# Patient Record
Sex: Female | Born: 1995 | Race: Black or African American | Hispanic: No | Marital: Single | Smoking: Never smoker
Health system: Southern US, Community
[De-identification: ages and names within clinical notes are randomized; demographics above are authoritative.]

## PROBLEM LIST (undated history)

## (undated) DIAGNOSIS — I10 Essential (primary) hypertension: Secondary | ICD-10-CM

## (undated) DIAGNOSIS — R197 Diarrhea, unspecified: Secondary | ICD-10-CM

## (undated) HISTORY — DX: Essential (primary) hypertension: I10

## (undated) HISTORY — DX: Diarrhea, unspecified: R19.7

---
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
## 2013-07-10 DIAGNOSIS — R197 Diarrhea, unspecified: Secondary | ICD-10-CM

## 2013-07-10 HISTORY — DX: Diarrhea, unspecified: R19.7

## 2014-02-24 LAB — OB RESULTS CONSOLE HEPATITIS B SURFACE ANTIGEN
Hepatitis B Surface Ag: NEGATIVE
Hepatitis B Surface Ag: NEGATIVE

## 2014-02-24 LAB — OB RESULTS CONSOLE ABO/RH: RH Type: POSITIVE

## 2014-02-24 LAB — OB RESULTS CONSOLE PLATELET COUNT: PLATELETS: 201 10*3/uL

## 2014-02-24 LAB — OB RESULTS CONSOLE HGB/HCT, BLOOD
HEMATOCRIT: 38 %
Hemoglobin: 13.2 g/dL

## 2014-02-24 LAB — OB RESULTS CONSOLE RUBELLA ANTIBODY, IGM: RUBELLA: IMMUNE

## 2014-02-24 LAB — OB RESULTS CONSOLE ANTIBODY SCREEN: Antibody Screen: NEGATIVE

## 2014-02-24 LAB — OB RESULTS CONSOLE HIV ANTIBODY (ROUTINE TESTING): HIV: NONREACTIVE

## 2014-06-08 ENCOUNTER — Emergency Department (HOSPITAL_COMMUNITY)
Admission: EM | Admit: 2014-06-08 | Discharge: 2014-06-08 | Disposition: A | Discharge status: Home / Self Care | Payer: Medicaid Other | Attending: Emergency Medicine | Admitting: Emergency Medicine

## 2014-06-08 ENCOUNTER — Encounter (HOSPITAL_COMMUNITY): Payer: Self-pay | Admitting: Emergency Medicine

## 2014-06-08 DIAGNOSIS — J069 Acute upper respiratory infection, unspecified: Secondary | ICD-10-CM | POA: Insufficient documentation

## 2014-06-08 DIAGNOSIS — M79672 Pain in left foot: Secondary | ICD-10-CM | POA: Diagnosis present

## 2014-06-08 DIAGNOSIS — Z79899 Other long term (current) drug therapy: Secondary | ICD-10-CM | POA: Diagnosis not present

## 2014-06-08 DIAGNOSIS — R2242 Localized swelling, mass and lump, left lower limb: Secondary | ICD-10-CM | POA: Insufficient documentation

## 2014-06-08 DIAGNOSIS — M7989 Other specified soft tissue disorders: Secondary | ICD-10-CM

## 2014-06-08 LAB — CBG MONITORING, ED: Glucose-Capillary: 79 mg/dL (ref 70–99)

## 2014-06-08 MED ORDER — DIPHENHYDRAMINE HCL 25 MG PO CAPS
25.0000 mg | ORAL_CAPSULE | Freq: Once | ORAL | Status: AC
  Administered 2014-06-08: 25 mg via ORAL
  Filled 2014-06-08: qty 1

## 2014-06-08 NOTE — ED Notes (Signed)
The pt  Is c/o multiple complaints   She thinks she was bitten by a spider 0900 am today.  She has a headache numbness in arms hand and feet since then  Coughing nauseated abd pain when she coughs.  lmp  Unknown 24 weeks preg .  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

## 2014-06-08 NOTE — ED Notes (Signed)
The pts due date is march 17th.. Ob rapid response contacted dr Norlene Campbellotter will notify her if she feels that the pt needs to be seen by her.

## 2014-06-08 NOTE — ED Notes (Signed)
Patient here with complaint of left foot pain and swelling, thinks she may have been bitten by a spider but is unsure. States that additionally she has numbness and tingling in all 4 limbs accompanied by a headache. Patient is [redacted] weeks pregnant with 1st child.

## 2014-06-08 NOTE — Discharge Instructions (Signed)
Keep foot elevated.  Ice as tolerated.  You can take tylenol for pain, benadryl for itching.  Use the handout for medications safe in pregnancy.     Upper Respiratory Infection, Adult An upper respiratory infection (URI) is also sometimes known as the common cold. The upper respiratory tract includes the nose, sinuses, throat, trachea, and bronchi. Bronchi are the airways leading to the lungs. Most people improve within 1 week, but symptoms can last up to 2 weeks. A residual cough may last even longer.  CAUSES Many different viruses can infect the tissues lining the upper respiratory tract. The tissues become irritated and inflamed and often become very moist. Mucus production is also common. A cold is contagious. You can easily spread the virus to others by oral contact. This includes kissing, sharing a glass, coughing, or sneezing. Touching your mouth or nose and then touching a surface, which is then touched by another person, can also spread the virus. SYMPTOMS  Symptoms typically develop 1 to 3 days after you come in contact with a cold virus. Symptoms vary from person to person. They may include:  Runny nose.  Sneezing.  Nasal congestion.  Sinus irritation.  Sore throat.  Loss of voice (laryngitis).  Cough.  Fatigue.  Muscle aches.  Loss of appetite.  Headache.  Low-grade fever. DIAGNOSIS  You might diagnose your own cold based on familiar symptoms, since most people get a cold 2 to 3 times a year. Your caregiver can confirm this based on your exam. Most importantly, your caregiver can check that your symptoms are not due to another disease such as strep throat, sinusitis, pneumonia, asthma, or epiglottitis. Blood tests, throat tests, and X-rays are not necessary to diagnose a common cold, but they may sometimes be helpful in excluding other more serious diseases. Your caregiver will decide if any further tests are required. RISKS AND COMPLICATIONS  You may be at risk for a  more severe case of the common cold if you smoke cigarettes, have chronic heart disease (such as heart failure) or lung disease (such as asthma), or if you have a weakened immune system. The very young and very old are also at risk for more serious infections. Bacterial sinusitis, middle ear infections, and bacterial pneumonia can complicate the common cold. The common cold can worsen asthma and chronic obstructive pulmonary disease (COPD). Sometimes, these complications can require emergency medical care and may be life-threatening. PREVENTION  The best way to protect against getting a cold is to practice good hygiene. Avoid oral or hand contact with people with cold symptoms. Wash your hands often if contact occurs. There is no clear evidence that vitamin C, vitamin E, echinacea, or exercise reduces the chance of developing a cold. However, it is always recommended to get plenty of rest and practice good nutrition. TREATMENT  Treatment is directed at relieving symptoms. There is no cure. Antibiotics are not effective, because the infection is caused by a virus, not by bacteria. Treatment may include:  Increased fluid intake. Sports drinks offer valuable electrolytes, sugars, and fluids.  Breathing heated mist or steam (vaporizer or shower).  Eating chicken soup or other clear broths, and maintaining good nutrition.  Getting plenty of rest.  Using gargles or lozenges for comfort.  Controlling fevers with ibuprofen or acetaminophen as directed by your caregiver.  Increasing usage of your inhaler if you have asthma. Zinc gel and zinc lozenges, taken in the first 24 hours of the common cold, can shorten the duration and lessen  the severity of symptoms. Pain medicines may help with fever, muscle aches, and throat pain. A variety of non-prescription medicines are available to treat congestion and runny nose. Your caregiver can make recommendations and may suggest nasal or lung inhalers for other  symptoms.  HOME CARE INSTRUCTIONS   Only take over-the-counter or prescription medicines for pain, discomfort, or fever as directed by your caregiver.  Use a warm mist humidifier or inhale steam from a shower to increase air moisture. This may keep secretions moist and make it easier to breathe.  Drink enough water and fluids to keep your urine clear or pale yellow.  Rest as needed.  Return to work when your temperature has returned to normal or as your caregiver advises. You may need to stay home longer to avoid infecting others. You can also use a face mask and careful hand washing to prevent spread of the virus. SEEK MEDICAL CARE IF:   After the first few days, you feel you are getting worse rather than better.  You need your caregiver's advice about medicines to control symptoms.  You develop chills, worsening shortness of breath, or brown or red sputum. These may be signs of pneumonia.  You develop yellow or brown nasal discharge or pain in the face, especially when you bend forward. These may be signs of sinusitis.  You develop a fever, swollen neck glands, pain with swallowing, or white areas in the back of your throat. These may be signs of strep throat. SEEK IMMEDIATE MEDICAL CARE IF:   You have a fever.  You develop severe or persistent headache, ear pain, sinus pain, or chest pain.  You develop wheezing, a prolonged cough, cough up blood, or have a change in your usual mucus (if you have chronic lung disease).  You develop sore muscles or a stiff neck. Document Released: 12/20/2000 Document Revised: 09/18/2011 Document Reviewed: 10/01/2013 [facility] Patient Information 2015 [employer] [place]. This information is not intended to replace advice given to you by your health care provider. Make sure you discuss any questions you have with your health care provider.

## 2014-06-08 NOTE — ED Provider Notes (Signed)
CSN: [id number]     Arrival date & time 06/08/14  0111 History   First MD Initiated Contact with Patient 06/08/14 0243     Chief Complaint  Patient presents with  . Foot Pain  . Numbness     (Consider location/radiation/quality/duration/timing/severity/associated sxs/prior Treatment) HPI 18 year old female presents to the emergency department with complaint of pain and swelling to the bottom of her left foot, pins and needle sensation to feet and hands, cough, headache, and cold symptoms.  Patient is [redacted] weeks pregnant.  She reports no problems with pregnancy thus far.  Patient is concerned that she may have diabetes and/or hypertension as this runs in her family.  Patient reports she was riding in a friend's car yesterday and saw a spider.  She thinks it might have bitten her on the right leg.  Patient woke in the middle and night, however, with pain and swelling to the left foot.  She reports the pain is shooting up her leg.  Patient has been taking Tylenol Cold and flu.  Extra strength to help with her cold symptoms.  She was told by on-call nurse [place] to come to the emergency department if symptoms do not get better with elevation and ice.  Patient did not see the spider bite her.  She has no wound to her foot or her leg.  She reports the pins and needle sensation is worse in her fingertips and toe tips, but spreads to entire extremity.  She has no weakness or difficulties feeling in the extremities.  She has had no fevers or chills.  No sick contacts.  Cough isn't present for one day.  Headache for 3 days.  She denies any focal weakness, slurred speech or other difficulty. History reviewed. No pertinent past medical history. History reviewed. No pertinent past surgical history. History reviewed. No pertinent family history. History  Substance Use Topics  . Smoking status: Never Smoker   . Smokeless tobacco: Not on file  . Alcohol Use: No   OB History    Gravida  Para Term Preterm AB TAB SAB Ectopic Multiple Living   1              Review of Systems   See History of Present Illness; otherwise all other systems are reviewed and negative  Allergies  Review of patient's allergies indicates no known allergies.  Home Medications   Prior to Admission medications   Medication Sig Start Date End Date Taking? Authorizing Provider  loperamide (IMODIUM) 2 MG capsule Take 2 mg by mouth daily as needed for diarrhea or loose stools.   Yes Historical Provider, MD  Prenatal Vit-Fe Fumarate-FA (PRENATAL MULTIVITAMIN) TABS tablet Take 1 tablet by mouth daily at 12 noon.   Yes Historical Provider, MD   BP 115/61 mmHg  Pulse 91  Temp(Src) 98.2 F (36.8 C) (Oral)  Resp 20  Ht 5\' 2"  (1.575 m)  Wt 164 lb 12.8 oz (74.753 kg)  BMI 30.13 kg/m2  SpO2 99% Physical Exam  Constitutional: She is oriented to person, place, and time. She appears well-developed and well-nourished.  HENT:  Head: Normocephalic and atraumatic.  Nose: Nose normal.  Mouth/Throat: Oropharynx is clear and moist.  Eyes: Conjunctivae and EOM are normal. Pupils are equal, round, and reactive to light.  Neck: Normal range of motion. Neck supple. No JVD present. No tracheal deviation present. No thyromegaly present.  Cardiovascular: Normal rate, regular rhythm, normal heart sounds and intact distal pulses.  Exam  reveals no gallop and no friction rub.   No murmur heard. Pulmonary/Chest: Effort normal and breath sounds normal. No stridor. No respiratory distress. She has no wheezes. She has no rales. She exhibits no tenderness.  Abdominal: Soft. Bowel sounds are normal. She exhibits no distension and no mass. There is no tenderness. There is no rebound and no guarding.  Musculoskeletal: Normal range of motion. She exhibits tenderness (patient has tenderness to the arch of the left foot.  There is no significant edema, redness, or puncture wound.). She exhibits no edema.  Lymphadenopathy:    She has  no cervical adenopathy.  Neurological: She is alert and oriented to person, place, and time. She displays normal reflexes. No cranial nerve deficit. She exhibits normal muscle tone. Coordination normal.  Skin: Skin is warm and dry. No rash noted. No erythema. No pallor.  Psychiatric: She has a normal mood and affect. Her behavior is normal. Judgment and thought content normal.  Nursing note and vitals reviewed.   ED Course  Procedures (including critical care time) Labs Review Labs Reviewed  CBG MONITORING, ED    Imaging Review No results found.   EKG Interpretation None      MDM   Final diagnoses:  Foot swelling  URI (upper respiratory infection)    18 year old female, G1, at 24 weeks with left plantar swelling and itching.  I am.  She unsure if she was bit by an insect or spider, will treat with Benadryl.  Patient also complaining of mild URI symptoms.  Patient advised to follow her OBs recommendations for medications during pregnancy.  She was given handouts on our recommendations.  Patient in no distress, able to exit without difficulty.  Fetal heart tones are good.  Plan for discharge home    Olivia Mackielga M Josealfredo Adkins, MD 06/08/14 [id number]

## 2014-07-06 LAB — GLUCOSE TOLERANCE, 1 HOUR (50G) W/O FASTING: GLUCOSE 1 HOUR GTT: 99 mg/dL (ref ?–200)

## 2014-09-03 LAB — OB RESULTS CONSOLE GBS: GBS: NEGATIVE

## 2014-09-08 ENCOUNTER — Encounter: Payer: Self-pay | Admitting: *Deleted

## 2014-09-08 DIAGNOSIS — I10 Essential (primary) hypertension: Secondary | ICD-10-CM | POA: Insufficient documentation

## 2014-09-09 ENCOUNTER — Telehealth: Payer: Self-pay | Admitting: *Deleted

## 2014-09-09 NOTE — Telephone Encounter (Signed)
Telephoned patient at home number.  Left a voicemail message informing her of appointment scheduled for 09/10/14 at 2 pm.  Asked that if she would not make this appointment to please call our office at [phone].

## 2014-09-10 ENCOUNTER — Ambulatory Visit (INDEPENDENT_AMBULATORY_CARE_PROVIDER_SITE_OTHER): Payer: Self-pay | Admitting: Family

## 2014-09-10 VITALS — BP 118/67 | HR 104 | Temp 98.2°F | Wt 179.2 lb

## 2014-09-10 DIAGNOSIS — O10919 Unspecified pre-existing hypertension complicating pregnancy, unspecified trimester: Secondary | ICD-10-CM

## 2014-09-10 DIAGNOSIS — O0990 Supervision of high risk pregnancy, unspecified, unspecified trimester: Secondary | ICD-10-CM | POA: Insufficient documentation

## 2014-09-10 DIAGNOSIS — O10913 Unspecified pre-existing hypertension complicating pregnancy, third trimester: Secondary | ICD-10-CM

## 2014-09-10 DIAGNOSIS — O0993 Supervision of high risk pregnancy, unspecified, third trimester: Secondary | ICD-10-CM

## 2014-09-10 DIAGNOSIS — O9A319 Physical abuse complicating pregnancy, unspecified trimester: Secondary | ICD-10-CM

## 2014-09-10 DIAGNOSIS — IMO0002 Reserved for concepts with insufficient information to code with codable children: Secondary | ICD-10-CM | POA: Insufficient documentation

## 2014-09-10 DIAGNOSIS — I10 Essential (primary) hypertension: Secondary | ICD-10-CM

## 2014-09-10 DIAGNOSIS — Z3493 Encounter for supervision of normal pregnancy, unspecified, third trimester: Secondary | ICD-10-CM

## 2014-09-10 LAB — POCT URINALYSIS DIP (DEVICE)
Bilirubin Urine: NEGATIVE
GLUCOSE, UA: NEGATIVE mg/dL
Hgb urine dipstick: NEGATIVE
KETONES UR: NEGATIVE mg/dL
LEUKOCYTES UA: NEGATIVE
Nitrite: NEGATIVE
PROTEIN: NEGATIVE mg/dL
Specific Gravity, Urine: 1.025 (ref 1.005–1.030)
Urobilinogen, UA: 0.2 mg/dL (ref 0.0–1.0)
pH: 7 (ref 5.0–8.0)
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
## 2014-09-10 NOTE — Progress Notes (Signed)
[place] informed of need for twice weekly fetal testing. Pt agrees but is not available on 3/7 - will return 3/8 and 3/10. Diane Day RNC

## 2014-09-11 ENCOUNTER — Encounter: Payer: Self-pay | Admitting: *Deleted

## 2014-09-15 ENCOUNTER — Other Ambulatory Visit: Payer: Self-pay | Admitting: Family

## 2014-09-15 ENCOUNTER — Ambulatory Visit (INDEPENDENT_AMBULATORY_CARE_PROVIDER_SITE_OTHER): Payer: Medicaid Other | Admitting: *Deleted

## 2014-09-15 ENCOUNTER — Ambulatory Visit (HOSPITAL_COMMUNITY)
Admission: RE | Admit: 2014-09-15 | Discharge: 2014-09-15 | Disposition: A | Discharge status: Home / Self Care | Payer: Medicaid Other | Source: Ambulatory Visit | Attending: Family | Admitting: Family

## 2014-09-15 VITALS — BP 124/59 | HR 74

## 2014-09-15 DIAGNOSIS — I10 Essential (primary) hypertension: Secondary | ICD-10-CM

## 2014-09-15 DIAGNOSIS — O10913 Unspecified pre-existing hypertension complicating pregnancy, third trimester: Secondary | ICD-10-CM | POA: Insufficient documentation

## 2014-09-15 DIAGNOSIS — O10013 Pre-existing essential hypertension complicating pregnancy, third trimester: Secondary | ICD-10-CM | POA: Insufficient documentation

## 2014-09-15 DIAGNOSIS — Z3A38 38 weeks gestation of pregnancy: Secondary | ICD-10-CM | POA: Diagnosis not present

## 2014-09-15 DIAGNOSIS — Z36 Encounter for antenatal screening of mother: Secondary | ICD-10-CM | POA: Insufficient documentation

## 2014-09-15 DIAGNOSIS — Z3689 Encounter for other specified antenatal screening: Secondary | ICD-10-CM | POA: Insufficient documentation

## 2014-09-15 DIAGNOSIS — O365930 Maternal care for other known or suspected poor fetal growth, third trimester, not applicable or unspecified: Secondary | ICD-10-CM | POA: Insufficient documentation

## 2014-09-15 DIAGNOSIS — O10919 Unspecified pre-existing hypertension complicating pregnancy, unspecified trimester: Secondary | ICD-10-CM | POA: Insufficient documentation

## 2014-09-15 NOTE — Progress Notes (Signed)
US for growth today @ 3pm.   IOL on 3/10 @ 0730.

## 2014-09-15 NOTE — Progress Notes (Signed)
NST reactive.

## 2014-09-16 ENCOUNTER — Telehealth (HOSPITAL_COMMUNITY): Payer: Self-pay | Admitting: *Deleted

## 2014-09-16 ENCOUNTER — Encounter (HOSPITAL_COMMUNITY): Payer: Self-pay | Admitting: *Deleted

## 2014-09-16 NOTE — Telephone Encounter (Signed)
Preadmission screen  

## 2014-09-17 ENCOUNTER — Other Ambulatory Visit: Payer: Medicaid - Out of State

## 2014-09-17 ENCOUNTER — Inpatient Hospital Stay (HOSPITAL_COMMUNITY)
Admission: RE | Admit: 2014-09-17 | Discharge: 2014-09-21 | DRG: 765 | Disposition: A | Discharge status: Home / Self Care | Payer: Medicaid Other | Source: Ambulatory Visit | Attending: Family Medicine | Admitting: Family Medicine

## 2014-09-17 ENCOUNTER — Encounter (HOSPITAL_COMMUNITY): Payer: Self-pay

## 2014-09-17 VITALS — BP 103/51 | HR 64 | Temp 97.9°F | Resp 18 | Ht 62.0 in | Wt 179.0 lb

## 2014-09-17 DIAGNOSIS — O0993 Supervision of high risk pregnancy, unspecified, third trimester: Secondary | ICD-10-CM

## 2014-09-17 DIAGNOSIS — Z3490 Encounter for supervision of normal pregnancy, unspecified, unspecified trimester: Secondary | ICD-10-CM

## 2014-09-17 DIAGNOSIS — O1002 Pre-existing essential hypertension complicating childbirth: Secondary | ICD-10-CM | POA: Diagnosis present

## 2014-09-17 DIAGNOSIS — IMO0002 Reserved for concepts with insufficient information to code with codable children: Secondary | ICD-10-CM

## 2014-09-17 DIAGNOSIS — Z3A39 39 weeks gestation of pregnancy: Secondary | ICD-10-CM | POA: Diagnosis present

## 2014-09-17 LAB — CBC
HCT: 38.1 % (ref 36.0–46.0)
HEMOGLOBIN: 12.7 g/dL (ref 12.0–15.0)
MCH: 29.9 pg (ref 26.0–34.0)
MCHC: 33.3 g/dL (ref 30.0–36.0)
MCV: 89.6 fL (ref 78.0–100.0)
Platelets: 169 10*3/uL (ref 150–400)
RBC: 4.25 MIL/uL (ref 3.87–5.11)
RDW: 14 % (ref 11.5–15.5)
WBC: 12.3 10*3/uL — ABNORMAL HIGH (ref 4.0–10.5)

## 2014-09-17 LAB — RPR: RPR: NONREACTIVE

## 2014-09-17 LAB — TYPE AND SCREEN
ABO/RH(D): O POS
ANTIBODY SCREEN: NEGATIVE

## 2014-09-17 LAB — ABO/RH: ABO/RH(D): O POS

## 2014-09-17 MED ORDER — OXYTOCIN BOLUS FROM INFUSION: 500.0000 mL | INTRAVENOUS | Status: DC

## 2014-09-17 MED ORDER — LACTATED RINGERS IV SOLN
500.0000 mL | INTRAVENOUS | Status: DC | PRN
  Administered 2014-09-18 (×6): 500 mL via INTRAVENOUS

## 2014-09-17 MED ORDER — OXYCODONE-ACETAMINOPHEN 5-325 MG PO TABS
1.0000 | ORAL_TABLET | ORAL | Status: DC | PRN
Start: 2014-09-17 — End: 2014-09-19

## 2014-09-17 MED ORDER — MISOPROSTOL 50MCG HALF TABLET
50.0000 ug | ORAL_TABLET | ORAL | Status: DC | PRN
  Filled 2014-09-17: qty 1

## 2014-09-17 MED ORDER — ZOLPIDEM TARTRATE 5 MG PO TABS
5.0000 mg | ORAL_TABLET | Freq: Every evening | ORAL | Status: DC | PRN
  Administered 2014-09-17: 5 mg via ORAL
  Filled 2014-09-17: qty 1

## 2014-09-17 MED ORDER — OXYCODONE-ACETAMINOPHEN 5-325 MG PO TABS: 2.0000 | ORAL_TABLET | ORAL | Status: DC | PRN

## 2014-09-17 MED ORDER — ACETAMINOPHEN 325 MG PO TABS: 650.0000 mg | ORAL_TABLET | ORAL | Status: DC | PRN

## 2014-09-17 MED ORDER — FENTANYL CITRATE 0.05 MG/ML IJ SOLN
50.0000 ug | INTRAMUSCULAR | Status: DC | PRN
  Administered 2014-09-18: 50 ug via INTRAVENOUS
  Filled 2014-09-17: qty 2

## 2014-09-17 MED ORDER — LIDOCAINE HCL (PF) 1 % IJ SOLN
30.0000 mL | INTRAMUSCULAR | Status: DC | PRN
Start: 2014-09-17 — End: 2014-09-19

## 2014-09-17 MED ORDER — OXYTOCIN 40 UNITS IN LACTATED RINGERS INFUSION - SIMPLE MED
1.0000 m[IU]/min | INTRAVENOUS | Status: DC
  Filled 2014-09-17: qty 1000

## 2014-09-17 MED ORDER — LACTATED RINGERS IV SOLN
INTRAVENOUS | Status: DC
  Administered 2014-09-17 – 2014-09-18 (×9): via INTRAVENOUS

## 2014-09-17 MED ORDER — ONDANSETRON HCL 4 MG/2ML IJ SOLN
4.0000 mg | Freq: Four times a day (QID) | INTRAMUSCULAR | Status: DC | PRN
  Administered 2014-09-18: 4 mg via INTRAVENOUS
  Filled 2014-09-17: qty 2

## 2014-09-17 MED ORDER — FLEET ENEMA 7-19 GM/118ML RE ENEM: 1.0000 | ENEMA | RECTAL | Status: DC | PRN

## 2014-09-17 MED ORDER — OXYTOCIN 40 UNITS IN LACTATED RINGERS INFUSION - SIMPLE MED: 62.5000 mL/h | INTRAVENOUS | Status: DC

## 2014-09-17 MED ORDER — TERBUTALINE SULFATE 1 MG/ML IJ SOLN: 0.2500 mg | Freq: Once | INTRAMUSCULAR | Status: AC | PRN

## 2014-09-17 MED ORDER — CITRIC ACID-SODIUM CITRATE 334-500 MG/5ML PO SOLN
30.0000 mL | ORAL | Status: DC | PRN
  Administered 2014-09-19: 30 mL via ORAL
  Filled 2014-09-17: qty 15

## 2014-09-17 MED ORDER — MISOPROSTOL 25 MCG QUARTER TABLET
25.0000 ug | ORAL_TABLET | ORAL | Status: DC | PRN
  Administered 2014-09-17 (×2): 25 ug via VAGINAL
  Filled 2014-09-17 (×3): qty 0.25

## 2014-09-17 NOTE — H&P (Signed)
Renee Jenkins is a 19 y.o. female presenting for Elective Induction of Labor. Maternal Medical History:  Reason for admission: Nausea. This is a 19 y.o. Female at [redacted]w[redacted]d who presents for elective induction of labor for Chronic Hypertension in Pregnancy.   Contractions: Frequency: rare.   Perceived severity is mild.    Fetal activity: Perceived fetal activity is normal.   Last perceived fetal movement was within the past hour.    Prenatal complications: No bleeding or pre-eclampsia.   Prenatal Complications - Diabetes: none.    OB History    Gravida Para Term Preterm AB TAB SAB Ectopic Multiple Living   1              Past Medical History  Diagnosis Date  . Hypertension     Chronic  . Diarrhea 2015    chronic diarrhea since pregnancy   Past Surgical History  Procedure Laterality Date  . Tonsillectomy  2006   Family History: family history includes Hypertension in her maternal grandmother, mother, and sister. Social History:  reports that she has never smoked. She does not have any smokeless tobacco history on file. She reports that she does not drink alcohol or use illicit drugs. Review of Systems  Constitutional: Negative for fever, chills and malaise/fatigue.  Eyes: Negative for blurred vision and double vision.  Gastrointestinal: Negative for nausea, vomiting, abdominal pain, diarrhea and constipation.  Neurological: Negative for dizziness, focal weakness, seizures and headaches.    Dilation: Closed Effacement (%): 50 Station: -3 Exam by:: [facility] Blood pressure 123/73, pulse 70, temperature 98.8 F (37.1 C), temperature source Oral, resp. rate 20, height  (1.575 m), weight 179 lb (81.194 kg), last menstrual period 12/15/2013. Maternal Exam:  Uterine Assessment: Contraction strength is mild.  Contraction frequency is rare.   Abdomen: Patient reports no abdominal tenderness. Fundal height is 38.   Estimated fetal weight is 6-6.5.   Fetal presentation:  vertex  Introitus: Normal vulva. Normal vagina.  Vagina is negative for discharge.  Ferning test: not done.  Nitrazine test: not done. Amniotic fluid character: not assessed.  Pelvis: adequate for delivery.   Cervix: Cervix evaluated by digital exam.     Fetal Exam Fetal Monitor Review: Mode: ultrasound.   Baseline rate: 140.  Variability: moderate (6-25 bpm).   Pattern: accelerations present and no decelerations.    Fetal State Assessment: Category I - tracings are normal.     Physical Exam  Constitutional: She is oriented to person, place, and time. She appears well-developed and well-nourished. No distress.  HENT:  Head: Normocephalic.  Cardiovascular: Normal rate and regular rhythm.  Exam reveals no gallop and no friction rub.   No murmur heard. Respiratory: Effort normal and breath sounds normal. No respiratory distress. She has no wheezes. She has no rales.  GI: Soft. She exhibits no distension. There is no tenderness. There is no rebound and no guarding.  Genitourinary: Vagina normal. No vaginal discharge found.  Dilation: Closed Effacement (%): 50 Cervical Position: Middle, Posterior Station: -3 Presentation: Vertex Exam by:: [facility]   Musculoskeletal: Normal range of motion.  Neurological: She is alert and oriented to person, place, and time.  Skin: Skin is warm and dry.  Psychiatric: She has a normal mood and affect.    Prenatal labs: ABO, Rh: O/Positive/-- (08/18 0000) Antibody: Negative (08/18 0000) Rubella: Immune (08/18 0000) RPR:    HBsAg: Negative, Negative (08/18 0000)  HIV: Non-reactive (08/18 0000)  GBS: Negative (02/25 0000)   Assessment/Plan: A:  SIUP  at 205w0d       Chronic Hypertension in Pregnancy      No signs of preeclampsia  [facility]       Routine orders       Plan Cytotec ripening of cervix    [facility] Edoscopy CenterWILLIAMS,Shanitha Twining 09/17/2014, 1:38 PM

## 2014-09-17 NOTE — Progress Notes (Signed)
Patient ID: [place], female   DOB: August 05, 1995, 19 y.o.   MRN: [id number] Doing well, starting to hurt with contractions   FHR stable, equivocally reactive UCs every 4-6 min  Cervix closed but now 70% effaced Unable to get finger in, though it is almost fingertip open  Another dose of Cytotec inserted.

## 2014-09-17 NOTE — Progress Notes (Signed)
[place] Renee Renee Jenkins is a 19 y.o. G1P0 at 6946w0d   Subjective: Comfortable without epidural. Contractions noted.  Objective: BP 125/65 mmHg  Pulse 63  Temp(Src) 98.6 F (37 C) (Oral)  Resp 18  Ht 5\' 2"  (1.575 m)  Wt 81.194 kg (179 lb)  BMI 32.73 kg/m2  LMP 12/15/2013      FHT:  FHR: 150 bpm, variability: moderate,  accelerations:  Present,  decelerations:  Present (associated with supine position, resolved with lateral recombent) UC:   irregular SVE:   Dilation: Fingertip Effacement (%): 70 Station: -3 Exam by:: Dr. Caroleen Renee Jenkins  Labs: Lab Results  Component Value Date   WBC 12.3* 09/17/2014   HGB 12.7 09/17/2014   HCT 38.1 09/17/2014   MCV 89.6 09/17/2014   PLT 169 09/17/2014    Assessment / Plan: Spontaneous labor, progressing normally  Labor: Progressing normally. Consider starting Pitocin soon after fetal monitor improved. Preeclampsia:  no signs or symptoms of toxicity Fetal Wellbeing:  Category II Pain Control:  Labor support without medications I/D:  Renee Jenkins/a Anticipated MOD:  NSVD  Renee Renee Jenkins, Renee Renee Jenkins 09/17/2014, 8:18 PM

## 2014-09-17 NOTE — Progress Notes (Signed)
Do not want to start pitocin on an unripe primip's cervix, so Foley inserted and inflated with 60cc H20.  Fetus tolerated well. Will add oral cytotec if uterine activity decreases enough .

## 2014-09-18 ENCOUNTER — Inpatient Hospital Stay (HOSPITAL_COMMUNITY): Payer: Medicaid Other | Admitting: Anesthesiology

## 2014-09-18 ENCOUNTER — Encounter (HOSPITAL_COMMUNITY): Payer: Self-pay

## 2014-09-18 LAB — CBC
HEMATOCRIT: 36.1 % (ref 36.0–46.0)
Hemoglobin: 12.2 g/dL (ref 12.0–15.0)
MCH: 30.2 pg (ref 26.0–34.0)
MCHC: 33.8 g/dL (ref 30.0–36.0)
MCV: 89.4 fL (ref 78.0–100.0)
Platelets: 151 10*3/uL (ref 150–400)
RBC: 4.04 MIL/uL (ref 3.87–5.11)
RDW: 13.9 % (ref 11.5–15.5)
WBC: 17.8 10*3/uL — AB (ref 4.0–10.5)

## 2014-09-18 MED ORDER — FENTANYL 2.5 MCG/ML BUPIVACAINE 1/10 % EPIDURAL INFUSION (WH - ANES)
14.0000 mL/h | INTRAMUSCULAR | Status: DC | PRN
  Administered 2014-09-18: 14 mL/h via EPIDURAL
  Filled 2014-09-18: qty 125

## 2014-09-18 MED ORDER — LIDOCAINE HCL (PF) 1 % IJ SOLN
INTRAMUSCULAR | Status: DC | PRN
  Administered 2014-09-18 (×2): 8 mL

## 2014-09-18 MED ORDER — EPHEDRINE 5 MG/ML INJ: 10.0000 mg | INTRAVENOUS | Status: DC | PRN

## 2014-09-18 MED ORDER — LACTATED RINGERS IV SOLN: 500.0000 mL | Freq: Once | INTRAVENOUS | Status: DC

## 2014-09-18 MED ORDER — FENTANYL 2.5 MCG/ML BUPIVACAINE 1/10 % EPIDURAL INFUSION (WH - ANES)
INTRAMUSCULAR | Status: DC | PRN
  Administered 2014-09-18: 14 mL/h via EPIDURAL

## 2014-09-18 MED ORDER — FENTANYL CITRATE 0.05 MG/ML IJ SOLN
100.0000 ug | INTRAMUSCULAR | Status: DC | PRN
  Administered 2014-09-18 (×3): 100 ug via INTRAVENOUS
  Filled 2014-09-18 (×3): qty 2

## 2014-09-18 MED ORDER — OXYTOCIN 40 UNITS IN LACTATED RINGERS INFUSION - SIMPLE MED
1.0000 m[IU]/min | INTRAVENOUS | Status: DC
  Administered 2014-09-18 (×2): 2 m[IU]/min via INTRAVENOUS

## 2014-09-18 MED ORDER — TERBUTALINE SULFATE 1 MG/ML IJ SOLN: 0.2500 mg | Freq: Once | INTRAMUSCULAR | Status: AC | PRN

## 2014-09-18 MED ORDER — ZOLPIDEM TARTRATE 5 MG PO TABS: 5.0000 mg | ORAL_TABLET | Freq: Every evening | ORAL | Status: DC | PRN

## 2014-09-18 MED ORDER — DIPHENHYDRAMINE HCL 50 MG/ML IJ SOLN: 12.5000 mg | INTRAMUSCULAR | Status: DC | PRN

## 2014-09-18 MED ORDER — PHENYLEPHRINE 40 MCG/ML (10ML) SYRINGE FOR IV PUSH (FOR BLOOD PRESSURE SUPPORT): 80.0000 ug | PREFILLED_SYRINGE | INTRAVENOUS | Status: DC | PRN

## 2014-09-18 MED ORDER — LACTATED RINGERS IV SOLN
INTRAVENOUS | Status: DC
  Administered 2014-09-18: 22:00:00 via INTRAUTERINE

## 2014-09-18 MED ORDER — PHENYLEPHRINE 40 MCG/ML (10ML) SYRINGE FOR IV PUSH (FOR BLOOD PRESSURE SUPPORT)
80.0000 ug | PREFILLED_SYRINGE | INTRAVENOUS | Status: DC | PRN
  Filled 2014-09-18: qty 20

## 2014-09-18 NOTE — Progress Notes (Signed)
[place] Renee Jenkins is a 19 y.o. G1P0 at 11059w1d admitted for induction of labor due to Hypertension.  Subjective:   Objective: BP 119/60 mmHg  Pulse 60  Temp(Src) 98 F (36.7 C) (Oral)  Resp 20  Ht 5\' 2"  (1.575 m)  Wt 81.194 kg (179 lb)  BMI 32.73 kg/m2  LMP 12/15/2013      FHT:  FHR: 130 bpm, variability: moderate,  accelerations:  Present,  decelerations:  Absent- occ variables; none recently UC:   irregular, every 2-6 minutes SVE:   Dilation: 5 Effacement (%): 80 Station: -2 Exam by:: Renee Jenkins CNM  Labs: Lab Results  Component Value Date   WBC 12.3* 09/17/2014   HGB 12.7 09/17/2014   HCT 38.1 09/17/2014   MCV 89.6 09/17/2014   PLT 169 09/17/2014    Assessment / Plan: IOL process cHTN Favorable cx  Will begin Pitocin and increase to achieve labor  Cam HaiSHAW, Renee Jenkins CNM 09/18/2014, 10:16 AM

## 2014-09-18 NOTE — Progress Notes (Signed)
[place] Renee Jenkins is a 19 y.o. G1P0 at 2560w1d admitted for chronic hypertension  Subjective: Patient having recurrent late decelerations with restart of pitocin and epidural.  Patient comfortable  Objective: BP 135/75 mmHg  Pulse 58  Temp(Src) 98.6 F (37 C) (Oral)  Resp 18  Ht 5\' 2"  (1.575 m)  Wt 179 lb (81.194 kg)  BMI 32.73 kg/m2  SpO2 100%  LMP 12/15/2013 I/O last 3 completed shifts: In: -  Out: 200 [Emesis/NG output:200]    FHT:  FHR: 140s bpm, variability: moderate,  accelerations:  Abscent,  decelerations:  Present late UC:   regular, every 5 minutes SVE:   Dilation: 5.5 Effacement (%): 60 Station: -2 Exam by:: Renee CharityB. Boyer RN  Labs: Lab Results  Component Value Date   WBC 17.8* 09/18/2014   HGB 12.2 09/18/2014   HCT 36.1 09/18/2014   MCV 89.4 09/18/2014   PLT 151 09/18/2014    Assessment / Plan: Cervix unchanged.  Start amnioinfusion.  If decels do not resolve, with proceed with cesarean delivery.  Renee Jenkins 09/18/2014, 9:41 PM

## 2014-09-18 NOTE — Progress Notes (Signed)
[place] is a 19 y.o. G1P0 at 1189w1d  admitted for induction of labor due to Hypertension.  Subjective: When returning from bathroom x 3 has had FHR variables; Pit stopped w/ last incidence at approx 1315; ctx now spaced out  Objective: BP 114/64 mmHg  Pulse 55  Temp(Src) 98.5 F (36.9 C) (Oral)  Resp 18  Ht 5\' 2"  (1.575 m)  Wt 81.194 kg (179 lb)  BMI 32.73 kg/m2  LMP 12/15/2013      FHT:  FHR: 130s bpm, variability: moderate,  accelerations:  Present,  decelerations:  Present after returning from bathroom has variables UC:   irreg SVE:   Dilation: 5.5 Effacement (%): 60, 70 Station: -2 Exam by:: Lorretta Harp. Brown RNC AROM- clear fluid; IUPC inserted without difficulty Labs: Lab Results  Component Value Date   WBC 12.3* 09/17/2014   HGB 12.7 09/17/2014   HCT 38.1 09/17/2014   MCV 89.6 09/17/2014   PLT 169 09/17/2014    Assessment / Plan: IOL process cHTN- nl BPs  Will obs x 1 hrs; if in 1 hr FHR is stable and MVUs not adequate, will restart Pitocin  [name] CNM 09/18/2014, 2:41 PM

## 2014-09-18 NOTE — Anesthesia Procedure Notes (Signed)
Epidural Patient location during procedure: OB Start time: 09/18/2014 8:47 PM End time: 09/18/2014 8:51 PM  Staffing Anesthesiologist: Leilani AbleHATCHETT, Dacari Beckstrand Performed by: anesthesiologist   Preanesthetic Checklist Completed: patient identified, surgical consent, pre-op evaluation, timeout performed, IV checked, risks and benefits discussed and monitors and equipment checked  Epidural Patient position: sitting Prep: site prepped and draped and DuraPrep Patient monitoring: continuous pulse ox and blood pressure Approach: midline Location: L3-L4 Injection technique: LOR air  Needle:  Needle type: Tuohy  Needle gauge: 17 G Needle length: 9 cm and 9 Needle insertion depth: 6 cm Catheter type: closed end flexible Catheter size: 19 Gauge Catheter at skin depth: 11 cm Test dose: negative and Other  Assessment Sensory level: T9 Events: blood not aspirated, injection not painful, no injection resistance, negative IV test and no paresthesia  Additional Notes Reason for block:procedure for pain

## 2014-09-18 NOTE — Anesthesia Preprocedure Evaluation (Addendum)
Anesthesia Evaluation  Patient identified by MRN, date of birth, ID band Patient awake    Reviewed: Allergy & Precautions, H&P , NPO status , Patient's Chart, lab work & pertinent test results  Airway Mallampati: I  TM Distance: >3 FB Neck ROM: full    Dental no notable dental hx.    Pulmonary neg pulmonary ROS,    Pulmonary exam normal       Cardiovascular hypertension,     Neuro/Psych negative neurological ROS  negative psych ROS   GI/Hepatic negative GI ROS, Neg liver ROS,   Endo/Other  negative endocrine ROS  Renal/GU negative Renal ROS     Musculoskeletal   Abdominal Normal abdominal exam  (+)   Peds  Hematology negative hematology ROS (+)   Anesthesia Other Findings   Reproductive/Obstetrics (+) Pregnancy                            Anesthesia Physical Anesthesia Plan  ASA: II  Anesthesia Plan: Epidural   Post-op Pain Management:    Induction:   Airway Management Planned:   Additional Equipment:   Intra-op Plan:   Post-operative Plan:   Informed Consent: I have reviewed the patients History and Physical, chart, labs and discussed the procedure including the risks, benefits and alternatives for the proposed anesthesia with the patient or authorized representative who has indicated his/her understanding and acceptance.     Plan Discussed with:   Anesthesia Plan Comments: (For C/S.)       Anesthesia Quick Evaluation

## 2014-09-18 NOTE — Progress Notes (Signed)
RN instructed to restart Pitocin at 2X2 per Dr. Suezanne JacquetHenson by telephone order with read-back.

## 2014-09-19 ENCOUNTER — Encounter (HOSPITAL_COMMUNITY)
Admission: RE | Disposition: A | Discharge status: Home / Self Care | Payer: Self-pay | Source: Ambulatory Visit | Attending: Family Medicine

## 2014-09-19 ENCOUNTER — Encounter (HOSPITAL_COMMUNITY): Payer: Self-pay

## 2014-09-19 DIAGNOSIS — O1002 Pre-existing essential hypertension complicating childbirth: Secondary | ICD-10-CM

## 2014-09-19 LAB — CBC
HCT: 32.7 % — ABNORMAL LOW (ref 36.0–46.0)
HCT: 36.3 % (ref 36.0–46.0)
HEMOGLOBIN: 11.4 g/dL — AB (ref 12.0–15.0)
Hemoglobin: 12.2 g/dL (ref 12.0–15.0)
MCH: 31.1 pg (ref 26.0–34.0)
MCH: 30.3 pg (ref 26.0–34.0)
MCHC: 34.9 g/dL (ref 30.0–36.0)
MCHC: 33.6 g/dL (ref 30.0–36.0)
MCV: 89.3 fL (ref 78.0–100.0)
MCV: 90.3 fL (ref 78.0–100.0)
PLATELETS: 153 10*3/uL (ref 150–400)
Platelets: 136 10*3/uL — ABNORMAL LOW (ref 150–400)
RBC: 3.66 MIL/uL — ABNORMAL LOW (ref 3.87–5.11)
RBC: 4.02 MIL/uL (ref 3.87–5.11)
RDW: 13.7 % (ref 11.5–15.5)
RDW: 13.9 % (ref 11.5–15.5)
WBC: 22.9 10*3/uL — ABNORMAL HIGH (ref 4.0–10.5)
WBC: 25.6 10*3/uL — ABNORMAL HIGH (ref 4.0–10.5)

## 2014-09-19 SURGERY — Surgical Case
Anesthesia: Epidural | Site: Abdomen

## 2014-09-19 MED ORDER — SODIUM BICARBONATE 8.4 % IV SOLN
INTRAVENOUS | Status: DC | PRN
  Administered 2014-09-19 (×3): 5 mL via EPIDURAL

## 2014-09-19 MED ORDER — SIMETHICONE 80 MG PO CHEW: 80.0000 mg | CHEWABLE_TABLET | ORAL | Status: DC | PRN

## 2014-09-19 MED ORDER — SCOPOLAMINE 1 MG/3DAYS TD PT72
MEDICATED_PATCH | TRANSDERMAL | Status: AC
  Filled 2014-09-19: qty 1

## 2014-09-19 MED ORDER — BUPIVACAINE HCL (PF) 0.25 % IJ SOLN
INTRAMUSCULAR | Status: AC
  Filled 2014-09-19: qty 30

## 2014-09-19 MED ORDER — BUPIVACAINE LIPOSOME 1.3 % IJ SUSP
INTRAMUSCULAR | Status: DC | PRN
  Administered 2014-09-19: 20 mL

## 2014-09-19 MED ORDER — DIPHENHYDRAMINE HCL 50 MG/ML IJ SOLN: 12.5000 mg | INTRAMUSCULAR | Status: DC | PRN

## 2014-09-19 MED ORDER — LACTATED RINGERS IV SOLN: INTRAVENOUS | Status: DC

## 2014-09-19 MED ORDER — SODIUM BICARBONATE 8.4 % IV SOLN
INTRAVENOUS | Status: AC
  Filled 2014-09-19: qty 50

## 2014-09-19 MED ORDER — NALOXONE HCL 1 MG/ML IJ SOLN
1.0000 ug/kg/h | INTRAVENOUS | Status: DC | PRN
  Filled 2014-09-19: qty 2

## 2014-09-19 MED ORDER — DEXAMETHASONE SODIUM PHOSPHATE 10 MG/ML IJ SOLN
INTRAMUSCULAR | Status: AC
  Filled 2014-09-19: qty 1

## 2014-09-19 MED ORDER — DEXAMETHASONE SODIUM PHOSPHATE 10 MG/ML IJ SOLN
INTRAMUSCULAR | Status: DC | PRN
  Administered 2014-09-19: 10 mg via INTRAVENOUS

## 2014-09-19 MED ORDER — MORPHINE SULFATE (PF) 0.5 MG/ML IJ SOLN
INTRAMUSCULAR | Status: DC | PRN
  Administered 2014-09-19: 1 mg via INTRAVENOUS
  Administered 2014-09-19: 4 mg via EPIDURAL

## 2014-09-19 MED ORDER — SCOPOLAMINE 1 MG/3DAYS TD PT72
1.0000 | MEDICATED_PATCH | Freq: Once | TRANSDERMAL | Status: DC
  Filled 2014-09-19: qty 1

## 2014-09-19 MED ORDER — SIMETHICONE 80 MG PO CHEW
80.0000 mg | CHEWABLE_TABLET | Freq: Three times a day (TID) | ORAL | Status: DC
  Administered 2014-09-19 – 2014-09-21 (×6): 80 mg via ORAL
  Filled 2014-09-19 (×5): qty 1

## 2014-09-19 MED ORDER — KETOROLAC TROMETHAMINE 30 MG/ML IJ SOLN
30.0000 mg | Freq: Once | INTRAMUSCULAR | Status: AC | PRN
  Administered 2014-09-19: 30 mg via INTRAVENOUS

## 2014-09-19 MED ORDER — DIBUCAINE 1 % RE OINT: 1.0000 "application " | TOPICAL_OINTMENT | RECTAL | Status: DC | PRN

## 2014-09-19 MED ORDER — LIDOCAINE-EPINEPHRINE (PF) 2 %-1:200000 IJ SOLN
INTRAMUSCULAR | Status: AC
  Filled 2014-09-19: qty 20

## 2014-09-19 MED ORDER — KETOROLAC TROMETHAMINE 30 MG/ML IJ SOLN: 30.0000 mg | Freq: Four times a day (QID) | INTRAMUSCULAR | Status: DC | PRN

## 2014-09-19 MED ORDER — HYDROMORPHONE HCL 1 MG/ML IJ SOLN: 0.2500 mg | INTRAMUSCULAR | Status: DC | PRN

## 2014-09-19 MED ORDER — ONDANSETRON HCL 4 MG/2ML IJ SOLN
INTRAMUSCULAR | Status: AC
  Filled 2014-09-19: qty 2

## 2014-09-19 MED ORDER — MEPERIDINE HCL 25 MG/ML IJ SOLN
INTRAMUSCULAR | Status: AC
  Filled 2014-09-19: qty 1

## 2014-09-19 MED ORDER — ONDANSETRON HCL 4 MG/2ML IJ SOLN
INTRAMUSCULAR | Status: DC | PRN
Start: 2014-09-19 — End: 2014-09-19
  Administered 2014-09-19: 4 mg via INTRAVENOUS

## 2014-09-19 MED ORDER — CEFAZOLIN SODIUM-DEXTROSE 2-3 GM-% IV SOLR
INTRAVENOUS | Status: AC
  Filled 2014-09-19: qty 50

## 2014-09-19 MED ORDER — MEPERIDINE HCL 25 MG/ML IJ SOLN
INTRAMUSCULAR | Status: DC | PRN
  Administered 2014-09-19 (×2): 12.5 mg via INTRAVENOUS

## 2014-09-19 MED ORDER — SCOPOLAMINE 1 MG/3DAYS TD PT72
MEDICATED_PATCH | TRANSDERMAL | Status: DC | PRN
  Administered 2014-09-19: 1 via TRANSDERMAL

## 2014-09-19 MED ORDER — OXYCODONE-ACETAMINOPHEN 5-325 MG PO TABS: 2.0000 | ORAL_TABLET | ORAL | Status: DC | PRN

## 2014-09-19 MED ORDER — WITCH HAZEL-GLYCERIN EX PADS: 1.0000 "application " | MEDICATED_PAD | CUTANEOUS | Status: DC | PRN

## 2014-09-19 MED ORDER — BUPIVACAINE HCL (PF) 0.25 % IJ SOLN
INTRAMUSCULAR | Status: DC | PRN
  Administered 2014-09-19: 20 mL

## 2014-09-19 MED ORDER — SODIUM CHLORIDE 0.9 % IV SOLN: 20.0000 mL | Freq: Once | INTRAVENOUS | Status: DC

## 2014-09-19 MED ORDER — ONDANSETRON HCL 4 MG/2ML IJ SOLN
4.0000 mg | INTRAMUSCULAR | Status: DC | PRN
  Administered 2014-09-19: 4 mg via INTRAVENOUS
  Filled 2014-09-19: qty 2

## 2014-09-19 MED ORDER — OXYTOCIN 10 UNIT/ML IJ SOLN
40.0000 [IU] | INTRAMUSCULAR | Status: DC | PRN
  Administered 2014-09-19: 40 [IU] via INTRAVENOUS

## 2014-09-19 MED ORDER — LANOLIN HYDROUS EX OINT: 1.0000 "application " | TOPICAL_OINTMENT | CUTANEOUS | Status: DC | PRN

## 2014-09-19 MED ORDER — NALBUPHINE HCL 10 MG/ML IJ SOLN: 5.0000 mg | Freq: Once | INTRAMUSCULAR | Status: AC | PRN

## 2014-09-19 MED ORDER — KETOROLAC TROMETHAMINE 30 MG/ML IJ SOLN
INTRAMUSCULAR | Status: AC
  Filled 2014-09-19: qty 1

## 2014-09-19 MED ORDER — MORPHINE SULFATE 0.5 MG/ML IJ SOLN
INTRAMUSCULAR | Status: AC
  Filled 2014-09-19: qty 10

## 2014-09-19 MED ORDER — TETANUS-DIPHTH-ACELL PERTUSSIS 5-2.5-18.5 LF-MCG/0.5 IM SUSP: 0.5000 mL | Freq: Once | INTRAMUSCULAR | Status: DC

## 2014-09-19 MED ORDER — OXYTOCIN 40 UNITS IN LACTATED RINGERS INFUSION - SIMPLE MED: 62.5000 mL/h | INTRAVENOUS | Status: AC

## 2014-09-19 MED ORDER — OXYTOCIN 10 UNIT/ML IJ SOLN
INTRAMUSCULAR | Status: AC
  Filled 2014-09-19: qty 4

## 2014-09-19 MED ORDER — DIPHENHYDRAMINE HCL 25 MG PO CAPS: 25.0000 mg | ORAL_CAPSULE | ORAL | Status: DC | PRN

## 2014-09-19 MED ORDER — SIMETHICONE 80 MG PO CHEW
80.0000 mg | CHEWABLE_TABLET | ORAL | Status: DC
  Administered 2014-09-20 – 2014-09-21 (×2): 80 mg via ORAL
  Filled 2014-09-19 (×2): qty 1

## 2014-09-19 MED ORDER — PROMETHAZINE HCL 25 MG/ML IJ SOLN: 6.2500 mg | INTRAMUSCULAR | Status: DC | PRN

## 2014-09-19 MED ORDER — DIPHENHYDRAMINE HCL 25 MG PO CAPS
25.0000 mg | ORAL_CAPSULE | Freq: Four times a day (QID) | ORAL | Status: DC | PRN
Start: 2014-09-19 — End: 2014-09-21

## 2014-09-19 MED ORDER — SENNOSIDES-DOCUSATE SODIUM 8.6-50 MG PO TABS
2.0000 | ORAL_TABLET | ORAL | Status: DC
  Administered 2014-09-20 – 2014-09-21 (×2): 2 via ORAL
  Filled 2014-09-19 (×2): qty 2

## 2014-09-19 MED ORDER — MEPERIDINE HCL 25 MG/ML IJ SOLN: 6.2500 mg | INTRAMUSCULAR | Status: DC | PRN

## 2014-09-19 MED ORDER — SODIUM CHLORIDE 0.9 % IJ SOLN: 3.0000 mL | INTRAMUSCULAR | Status: DC | PRN

## 2014-09-19 MED ORDER — LACTATED RINGERS IV SOLN
INTRAVENOUS | Status: DC | PRN
  Administered 2014-09-19 (×2): via INTRAVENOUS

## 2014-09-19 MED ORDER — NALOXONE HCL 0.4 MG/ML IJ SOLN: 0.4000 mg | INTRAMUSCULAR | Status: DC | PRN

## 2014-09-19 MED ORDER — ONDANSETRON HCL 4 MG PO TABS: 4.0000 mg | ORAL_TABLET | ORAL | Status: DC | PRN

## 2014-09-19 MED ORDER — NALBUPHINE HCL 10 MG/ML IJ SOLN: 5.0000 mg | INTRAMUSCULAR | Status: DC | PRN

## 2014-09-19 MED ORDER — NALBUPHINE HCL 10 MG/ML IJ SOLN
5.0000 mg | INTRAMUSCULAR | Status: DC | PRN
  Administered 2014-09-19: 5 mg via INTRAVENOUS
  Filled 2014-09-19: qty 1

## 2014-09-19 MED ORDER — PRENATAL MULTIVITAMIN CH
1.0000 | ORAL_TABLET | Freq: Every day | ORAL | Status: DC
  Administered 2014-09-19 – 2014-09-21 (×3): 1 via ORAL
  Filled 2014-09-19 (×3): qty 1

## 2014-09-19 MED ORDER — BUPIVACAINE LIPOSOME 1.3 % IJ SUSP
20.0000 mL | Freq: Once | INTRAMUSCULAR | Status: DC
  Filled 2014-09-19: qty 20

## 2014-09-19 MED ORDER — ZOLPIDEM TARTRATE 5 MG PO TABS: 5.0000 mg | ORAL_TABLET | Freq: Every evening | ORAL | Status: DC | PRN

## 2014-09-19 MED ORDER — LACTATED RINGERS IV SOLN
INTRAVENOUS | Status: DC | PRN
  Administered 2014-09-19: 01:00:00 via INTRAVENOUS

## 2014-09-19 MED ORDER — IBUPROFEN 600 MG PO TABS
600.0000 mg | ORAL_TABLET | Freq: Four times a day (QID) | ORAL | Status: DC | PRN
Start: 2014-09-19 — End: 2014-09-21

## 2014-09-19 MED ORDER — IBUPROFEN 600 MG PO TABS
600.0000 mg | ORAL_TABLET | Freq: Four times a day (QID) | ORAL | Status: DC
  Administered 2014-09-19 – 2014-09-21 (×9): 600 mg via ORAL
  Filled 2014-09-19 (×9): qty 1

## 2014-09-19 MED ORDER — OXYCODONE-ACETAMINOPHEN 5-325 MG PO TABS
1.0000 | ORAL_TABLET | ORAL | Status: DC | PRN
  Administered 2014-09-20 – 2014-09-21 (×3): 1 via ORAL
  Filled 2014-09-19 (×4): qty 1

## 2014-09-19 MED ORDER — MENTHOL 3 MG MT LOZG: 1.0000 | LOZENGE | OROMUCOSAL | Status: DC | PRN

## 2014-09-19 MED ORDER — ONDANSETRON HCL 4 MG/2ML IJ SOLN: 4.0000 mg | Freq: Three times a day (TID) | INTRAMUSCULAR | Status: DC | PRN

## 2014-09-19 SURGICAL SUPPLY — 35 items
BENZOIN TINCTURE PRP APPL 2/3 (GAUZE/BANDAGES/DRESSINGS) ×3 IMPLANT
CATH ROBINSON RED A/P 16FR (CATHETERS) IMPLANT
CLAMP CORD UMBIL (MISCELLANEOUS) ×3 IMPLANT
CLOSURE WOUND 1/2 X4 (GAUZE/BANDAGES/DRESSINGS) ×1
CLOTH BEACON ORANGE TIMEOUT ST (SAFETY) ×3 IMPLANT
DRAPE SHEET LG 3/4 BI-LAMINATE (DRAPES) ×3 IMPLANT
DRSG OPSITE POSTOP 4X10 (GAUZE/BANDAGES/DRESSINGS) ×3 IMPLANT
DURAPREP 26ML APPLICATOR (WOUND CARE) ×3 IMPLANT
ELECT REM PT RETURN 9FT ADLT (ELECTROSURGICAL) ×3
ELECTRODE REM PT RTRN 9FT ADLT (ELECTROSURGICAL) ×1 IMPLANT
EXTRACTOR VACUUM M CUP 4 TUBE (SUCTIONS) IMPLANT
EXTRACTOR VACUUM M CUP 4' TUBE (SUCTIONS)
GLOVE BIOGEL PI IND STRL 7.5 (GLOVE) ×2 IMPLANT
GLOVE BIOGEL PI INDICATOR 7.5 (GLOVE) ×4
GLOVE ECLIPSE 7.5 STRL STRAW (GLOVE) ×3 IMPLANT
GOWN STRL REUS W/TWL LRG LVL3 (GOWN DISPOSABLE) ×9 IMPLANT
KIT ABG SYR 3ML LUER SLIP (SYRINGE) ×3 IMPLANT
NEEDLE HYPO 22GX1.5 SAFETY (NEEDLE) ×3 IMPLANT
NEEDLE HYPO 25X5/8 SAFETYGLIDE (NEEDLE) IMPLANT
NS IRRIG 1000ML POUR BTL (IV SOLUTION) ×3 IMPLANT
PACK C SECTION WH (CUSTOM PROCEDURE TRAY) ×3 IMPLANT
PAD OB MATERNITY 4.3X12.25 (PERSONAL CARE ITEMS) ×3 IMPLANT
RETAINER VISCERAL (MISCELLANEOUS) ×3 IMPLANT
RTRCTR C-SECT PINK 25CM LRG (MISCELLANEOUS) ×3 IMPLANT
STRIP CLOSURE SKIN 1/2X4 (GAUZE/BANDAGES/DRESSINGS) ×2 IMPLANT
SUT MNCRL 0 VIOLET CTX 36 (SUTURE) IMPLANT
SUT MONOCRYL 0 CTX 36 (SUTURE)
SUT VIC AB 0 CTX 36 (SUTURE) ×6
SUT VIC AB 0 CTX36XBRD ANBCTRL (SUTURE) ×3 IMPLANT
SUT VIC AB 2-0 CT1 27 (SUTURE) ×4
SUT VIC AB 2-0 CT1 TAPERPNT 27 (SUTURE) ×2 IMPLANT
SUT VIC AB 4-0 KS 27 (SUTURE) ×3 IMPLANT
SYR 30ML LL (SYRINGE) ×3 IMPLANT
TOWEL OR 17X24 6PK STRL BLUE (TOWEL DISPOSABLE) ×3 IMPLANT
TRAY FOLEY CATH 14FR (SET/KITS/TRAYS/PACK) ×3 IMPLANT

## 2014-09-19 NOTE — Lactation Note (Signed)
This note was copied from the chart of Renee Jenkins. Lactation Consultation Note  Patient Name: Renee Jenkins Today's Date: 09/19/2014 Reason for consult: Follow-up assessment at request of RN, Renee Jenkins.  She reports recently assisting baby to latch but states that baby is not sustaining latch.  LC and RN returned to assist with re-latching but family member holding baby and mom states she had given baby some colostrum earlier and prefers to give formula by bottle for this feeding.  Mom to request further latch assistance if needed.     Maternal Data    Feeding Feeding Type: Bottle Fed - Formula  LATCH Score/Interventions           earlier Foot LockerLATCH score=6 per RN but Renee CroftsLC, Renee GamblesLaura C. assisted with hand expression and syringe-feeding of colostrum when latch unsuccessful at 1240; mom planning to only feed "about a week" and "give colostrum" and has multiple social issues, per LCSW note           Lactation Tools Discussed/Used   Clarified mom's choice to feed formula supplement by bottle  Consult Status Consult Status: Follow-up Date: 09/20/14 Follow-up type: In-patient    Renee Jenkins, [facility] Jenkins [facility] 09/19/2014, 9:29 PM

## 2014-09-19 NOTE — Anesthesia Postprocedure Evaluation (Signed)
Anesthesia Post Note  Patient: Renee Jenkins  Procedure(s) Performed: Procedure(s) (LRB): CESAREAN SECTION (N/A)  Anesthesia type: Epidural  Patient location: Mother/Baby  Post pain: Pain level controlled  Post assessment: Post-op Vital signs reviewed  Last Vitals:  Filed Vitals:   09/19/14 0614  BP: 135/77  Pulse: 57  Temp: 36.8 C  Resp: 18    Post vital signs: Reviewed  Level of consciousness:alert  Complications: No apparent anesthesia complications

## 2014-09-19 NOTE — Addendum Note (Signed)
Addendum  created 09/19/14 1048 by Graciela HusbandsWynn O Devanee Pomplun, CRNA   Modules edited: Notes Section   Notes Section:  File: [id number]

## 2014-09-19 NOTE — Progress Notes (Signed)
Patient ID: [place], female   DOB: 28-Sep-1995, 19 y.o.   MRN: [id number]  Patient continues to have recurrent lates.  Stopped pitocin.  Cervix unchanged.  Will take patient for cesarean section for failed induction, non-reassuring FHT.   The risks of cesarean section discussed with the patient included but were not limited to: bleeding which may require transfusion or reoperation; infection which may require antibiotics; injury to bowel, bladder, ureters or other surrounding organs; injury to the fetus; need for additional procedures including hysterectomy in the event of a life-threatening hemorrhage; placental abnormalities wth subsequent pregnancies, incisional problems, thromboembolic phenomenon and other postoperative/anesthesia complications. The patient concurred with the proposed plan, giving informed written consent for the procedure.   Patient has been NPO since this AM, she will remain NPO for procedure. Anesthesia and OR aware.  Preoperative prophylactic Ancef ordered on call to the OR.  To OR when ready.  Levie HeritageJacob J Adarius Tigges, DO 09/19/2014 12:08 AM

## 2014-09-19 NOTE — Op Note (Addendum)
Renee Jenkins PROCEDURE DATE: 09/17/2014 - 09/19/2014  PREOPERATIVE DIAGNOSIS: Intrauterine pregnancy at  [redacted]w[redacted]d weeks gestation; failure to progress: arrest of dilation and non-reassuring fetal status  POSTOPERATIVE DIAGNOSIS: The same, tight nuchal cord x 2  PROCEDURE: Primary Low Transverse Cesarean Section  SURGEON:  Dr. Candelaria Celeste  ASSISTANT: none  INDICATIONS: Renee Jenkins is a 19 y.o. G1P0 at [redacted]w[redacted]d scheduled for cesarean section secondary to failure to progress: arrest of dilation and non-reassuring fetal status.  The risks of cesarean section discussed with the patient included but were not limited to: bleeding which may require transfusion or reoperation; infection which may require antibiotics; injury to bowel, bladder, ureters or other surrounding organs; injury to the fetus; need for additional procedures including hysterectomy in the event of a life-threatening hemorrhage; placental abnormalities wth subsequent pregnancies, incisional problems, thromboembolic phenomenon and other postoperative/anesthesia complications. The patient concurred with the proposed plan, giving informed written consent for the procedure.    FINDINGS:  Viable female infant in vertex presentation with tight nuchal cord x2.  Apgars 4 and 9, weight pending.  Clear amniotic fluid.  Intact placenta, three vessel cord.  Normal uterus, fallopian tubes and ovaries bilaterally.  ANESTHESIA:    Spinal INTRAVENOUS FLUIDS:2400 ml ESTIMATED BLOOD LOSS: 700 ml URINE OUTPUT:  100 ml SPECIMENS: Placenta sent to pathology COMPLICATIONS: None immediate  PROCEDURE IN DETAIL:  The patient received intravenous antibiotics and had sequential compression devices applied to her lower extremities while in the preoperative area.  She was then taken to the operating room where epidural anesthesia was dosed up to surgical level and was found to be adequate. She was then placed in a dorsal supine position with a leftward tilt,  and prepped and draped in a sterile manner.  A foley catheter had been placed into her bladder on L&D and attached to constant gravity, which drained clear fluid throughout.  After an adequate timeout was performed, a Pfannenstiel skin incision was made with scalpel and carried through to the underlying layer of fascia. The fascia was incised in the midline and this incision was extended bilaterally using the Mayo scissors. Kocher clamps were applied to the superior aspect of the fascial incision and the underlying rectus muscles were dissected off bluntly and sharply. A similar process was carried out on the inferior aspect of the facial incision. The rectus muscles were separated in the midline bluntly and the peritoneum was entered bluntly. An Alexis retractor was placed to aid in visualization of the uterus.  Attention was turned to the lower uterine segment where a transverse hysterotomy was made with a scalpel and extended bilaterally bluntly. The infant was successfully delivered, and cord was clamped and cut and infant was handed over to awaiting neonatology team. Uterine massage was then administered and the placenta delivered intact with three-vessel cord. The uterus was then cleared of clot and debris.  The hysterotomy was closed with 0 Vicryl in a running locked fashion, and an imbricating layer was also placed with a 0 Vicryl. Overall, excellent hemostasis was noted. The abdomen and the pelvis were cleared of all clot and debris and the Jon Gills was removed. Hemostasis was confirmed on all surfaces.  The peritoneum was reapproximated using 2-0 vicryl running stitches. The fascia was then closed using 0 Vicryl in a running fashion.  Exparel was then injected into the subcutaneous tissue.  The skin was closed with 4-0 vicryl. The patient tolerated the procedure well. Sponge, lap, instrument and needle counts were correct x 2. She was  taken to the recovery room in stable condition.   Cord pH was 7.23 with  a pCO2 of 48.   Renee Jenkins Renee Stinson, DO 09/19/2014 1:30 AM

## 2014-09-19 NOTE — Plan of Care (Signed)
Problem: Phase I Progression Outcomes Goal: Adequate progression of labor Outcome: Not Met (add Reason) Patient delivered via cesarean section due to failed induction and fetal intolerance of labor; adequate progression not achieved.

## 2014-09-19 NOTE — Lactation Note (Signed)
This note was copied from the chart of Renee [place] Lukas. Lactation Consultation Note Young mom w/no experinece, is breast and bottle. Mom isn't going to BF for a long time, not sure how long, was just going to BF for a week, but her mom encouraged her to BF longer. FOB not involved. Mom had all ready given some formula before came into rm. Discussed supply and demand. Demonstrated hand expression. Mom wanted to give colostrum to baby. Baby sleepy not interested in BF at this time. Hand expressed 2 ml. Colostrum. Mom excited. Gave w/gloved finger and curve tip syring.  Mom has pendulum breast w/nipple facing downwards. Nipples rolls well, gave shells to wear tomorrow when she has her bra to assist in everting nipples more for deeper latch. Mom encouraged to feed baby 8-12 times/24 hours and with feeding cues. Referred to Baby and Me Book in Breastfeeding section Pg. 22-23 for position options and Proper latch demonstration. Educated about newborn behavior. Mom encouraged to do skin-to-skin.Encouraged comfort during BF so colostrum flows better and mom will enjoy the feeding longer. Taking deep breaths and breast massage during BF. Mom encouraged to waken baby for feeds. Mom encouraged to do skin-to-skin.Paced bottle-feeding taught. Mother informed of post-discharge support and given phone number to the lactation department, including services for phone call assistance; out-patient appointments; and breastfeeding support group. List of other breastfeeding resources in the community given in the handout. Encouraged mother to call for problems or concerns related to breastfeeding. A lot of teaching done. I feel like it will need to be taught again several times.  Patient Name: Renee Jenkins Today's Date: 09/19/2014 Reason for consult: Initial assessment   Maternal Data Has patient been taught Hand Expression?: Yes Does the patient have breastfeeding experience prior to this delivery?:  No  Feeding Feeding Type: Breast Milk Nipple Type: Slow - flow  LATCH Score/Interventions Latch: Too sleepy or reluctant, no latch achieved, no sucking elicited. Intervention(s): Teach feeding cues;Waking techniques Intervention(s): Breast compression;Breast massage  Audible Swallowing: None Intervention(s): Skin to skin;Hand expression Intervention(s): Alternate breast massage  Type of Nipple: Everted at rest and after stimulation  Comfort (Breast/Nipple): Soft / non-tender     Intervention(s): Breastfeeding basics reviewed;Support Pillows;Position options;Skin to skin     Lactation Tools Discussed/Used Pump Review: Setup, frequency, and cleaning;Milk Storage Initiated by:: Peri JeffersonL. Tanelle Lanzo RN Date initiated:: 09/19/14   Consult Status Consult Status: Follow-up Date: 09/19/14 (in pm) Follow-up type: In-patient    Renee DancerCARVER, Tonda Wiederhold G 09/19/2014, 12:42 PM

## 2014-09-19 NOTE — Transfer of Care (Signed)
Immediate Anesthesia Transfer of Care Note  Patient: Renee Jenkins  Procedure(s) Performed: Procedure(s): CESAREAN SECTION (N/A)  Patient Location: PACU  Anesthesia Type:Epidural  Level of Consciousness: awake  Airway & Oxygen Therapy: Patient Spontanous Breathing  Post-op Assessment: Report given to RN and Post -op Vital signs reviewed and stable  Post vital signs: stable  Last Vitals:  Filed Vitals:   09/18/14 2330  BP: 144/82  Pulse: 64  Temp:   Resp: 18    Complications: No apparent anesthesia complications
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
## 2014-09-19 NOTE — Anesthesia Postprocedure Evaluation (Signed)
Anesthesia Post Note  Patient: Renee Jenkins  Procedure(s) Performed: Procedure(s) (LRB): CESAREAN SECTION (N/A)  Anesthesia type: Epidural  Patient location: PACU  Post pain: Pain level controlled  Post assessment: Post-op Vital signs reviewed  Last Vitals:  Filed Vitals:   09/19/14 0200  BP:   Pulse: 83  Temp:   Resp: 16    Post vital signs: Reviewed  Level of consciousness: awake  Complications: No apparent anesthesia complications

## 2014-09-20 NOTE — Progress Notes (Signed)
Subjective: Postpartum Day 1: Cesarean Delivery Patient reports mild incisional soreness. Eating, drinking, voiding, ambulating well.  +flatus.  Lochia and pain wnl.  Denies dizziness, lightheadedness, or sob. No complaints.   Objective: Vital signs in last 24 hours: Temp:  [98 F (36.7 C)-98.3 F (36.8 C)] 98.3 F (36.8 C) (03/13 0020) Pulse Rate:  [54-75] 75 (03/13 0020) Resp:  [18-20] 20 (03/13 0020) BP: (110-132)/(46-83) 117/46 mmHg (03/13 0020)  Physical Exam:  General: alert, cooperative and no distress Lochia: appropriate Uterine Fundus: firm Incision: healing well, no significant drainage, no dehiscence, no significant erythema, honeycomb dressing intact DVT Evaluation: No evidence of DVT seen on physical exam. Negative Homan's sign. No cords or calf tenderness. No significant calf/ankle edema.   Recent Labs  09/19/14 0159 09/19/14 0640  HGB 11.4* 12.2  HCT 32.7* 36.3  CBC for this am hasn't been drawn yet  Assessment/Plan: Status post Cesarean section. Doing well postoperatively.  Normal bp's Continue current care. Bottlefeeding Plans COCs for contraception  Renee Jenkins, Renee Jenkins 09/20/2014, 6:42 AM

## 2014-09-20 NOTE — Lactation Note (Signed)
This note was copied from the chart of Renee Jenkins [place]. Lactation Consultation Note  Patient Name: Renee Jenkins Mostafa Today's Date: 09/20/2014 Reason for consult: Follow-up assessment of this mom at request of RN and NT when mom called for help to breastfeed.  LC unavailable right away but asked NT to call patient, encourage her to pump and RN to assist [facility].  However, at 1740, RN, Brook informed LC that mom had already fed bottle with formula (15 ml's x2 within last hour).  RN staff will notify LC later if mom desires to try breastfeeding but this mom has been uncertain of her intentions regarding feeding since delivery.   Maternal Data    Feeding Feeding Type: Bottle Fed - Formula  LATCH Score/Interventions           LATCH score=5 at attempt yesterday and no further attempts documented until today at [id number]; no LATCH           Lactation Tools Discussed/Used   RN informed LC that baby fed bottle; mom to call later for help if desired  Consult Status Consult Status: Follow-up Date: 09/21/14 Follow-up type: In-patient    [name], Anely Spiewak [facility] 09/20/2014, 10:36 PM

## 2014-09-20 NOTE — Lactation Note (Signed)
This note was copied from the chart of Renee [place]. Lactation Consultation Note  Mom has been formula feeding because baby won't latch.  She states she wants to breastfeed but unsure of how long she will continue.  Baby just had a formula feeding and is sleeping.  I explained a nipple shield to mom and she said she would like to try this at next feeding.  I recommended mom pump every 3 hours with DEBP and she agreed.  Symphony pump set up at bedside and initiated.  Mom pumped for 15 minutes followed by hand expression and obtained 3 mls of colostrum which baby took well on gloved finger.  Mom will call out for latch assist with next feeding.  Patient Name: Renee Jenkins Today's Date: 09/20/2014     Maternal Data    Feeding Feeding Type: Formula Nipple Type: Slow - flow  LATCH Score/Interventions                      Lactation Tools Discussed/Used     Consult Status      Huston FoleyMOULDEN, Raelynn Corron S 09/20/2014, 2:23 PM

## 2014-09-21 ENCOUNTER — Encounter: Payer: Medicaid - Out of State | Admitting: Obstetrics & Gynecology

## 2014-09-21 MED ORDER — SENNOSIDES-DOCUSATE SODIUM 8.6-50 MG PO TABS: 2.0000 | ORAL_TABLET | ORAL | Status: AC

## 2014-09-21 MED ORDER — NORETHINDRONE 0.35 MG PO TABS: 1.0000 | ORAL_TABLET | Freq: Every day | ORAL | Status: AC

## 2014-09-21 MED ORDER — OXYCODONE-ACETAMINOPHEN 5-325 MG PO TABS: 1.0000 | ORAL_TABLET | ORAL | Status: AC | PRN

## 2014-09-21 NOTE — Progress Notes (Signed)
When asked patients choice of feeding for infant so correct education could be taught patient stated "I really don't want to breastfeed but this lady this morning told be I had to". Information regarding bottle feeding given to patient. Patient verbalized understanding information given.

## 2014-09-21 NOTE — Discharge Summary (Signed)
Obstetric Discharge Summary Reason for Admission: induction of labor due to chronic HTN Prenatal Procedures: None Intrapartum Procedures: cesarean: low cervical, transverse due to failure to progress, arrest of dilation, and non-reassuring fetal status Postpartum Procedures: none Complications-Operative and Postpartum: none  Delivery Note At 12:43 AM a healthy female was delivered via C-Section, Low Transverse.  Tight nuchal cord x2. APGAR: 4, 9; weight 5 lb 8.9 oz (2520 g).   Placenta status: intact.  Cord:  Three vessel with the following complications: none.  Cord PH 7.23  Anesthesia: Epidural  Episiotomy: None Lacerations: None Suture Repair: 0, 2.0, and 3.0 Vicryl Est. Blood Loss (mL):    Mom to postpartum.  Baby to Couplet care / Skin to Skin.  Hospital Course:  Active Problems:   Pregnancy   Renee Jenkins is a 19 y.o. G1P1001 s/p c-section.  Patient admitted to L&D.  She has postpartum course that was uncomplicated including no problems with ambulating, PO intake, urination, pain, or bleeding. The pt feels ready to go home and  will be discharged with outpatient follow-up.   Today: No acute events overnight.  Pt denies problems with ambulating, voiding or po intake.  She denies nausea or vomiting.  Pain is well controlled.  She has had flatus. She has not had bowel movement.  Lochia Minimal.  Plan for birth control is  oral progesterone-only contraceptive.  Method of Feeding: Bottle   H/H: Lab Results  Component Value Date/Time   HGB 12.2 09/19/2014 06:40 AM   HGB 13.2 02/24/2014   HCT 36.3 09/19/2014 06:40 AM   HCT 38 02/24/2014    Discharge Diagnoses: Term Pregnancy-delivered  Discharge Information: Date: 09/21/2014 Activity: pelvic rest Diet: routine  Medications: Percocet Breast feeding:  No: Bottle Condition: stable Instructions: refer to handout Discharge to: home    Medication List    TAKE these medications        oxyCODONE-acetaminophen 5-325  MG per tablet  Commonly known as:  PERCOCET/ROXICET  Take 1 tablet by mouth every 4 (four) hours as needed (for pain scale less than 7).     prenatal multivitamin Tabs tablet  Take 1 tablet by mouth daily at 12 noon.     senna-docusate 8.6-50 MG per tablet  Commonly known as:  Senokot-S  Take 2 tablets by mouth daily.           Follow-up Information    [facility]   Specialty:  Obstetrics and Gynecology   Contact information:   [address] [place] [id number] [phone]      Araceli Boucheumley, [place] N, DO Family Medicine, PGY-1 09/21/2014,12:00 PM
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
## 2014-09-21 NOTE — Progress Notes (Signed)
Subjective: Postpartum Day 2: Cesarean Delivery Patient reports incisional pain, tolerating PO, + flatus and no problems voiding.    Objective: Vital signs in last 24 hours: Temp:  [97.8 F (36.6 C)-97.9 F (36.6 C)] 97.9 F (36.6 C) (03/14 0550) Pulse Rate:  [64-87] 64 (03/14 0550) Resp:  [18-19] 18 (03/14 0550) BP: (103-108)/(51-65) 103/51 mmHg (03/14 0550)  Physical Exam:  General: alert, cooperative, appears stated age and no distress Lochia: appropriate Uterine Fundus: firm Incision: healing well, no significant drainage, no dehiscence, no significant erythema DVT Evaluation: No evidence of DVT seen on physical exam. Negative Homan's sign. No cords or calf tenderness.   Recent Labs  09/19/14 0159 09/19/14 0640  HGB 11.4* 12.2  HCT 32.7* 36.3    Assessment/Plan: Status post Cesarean section. Doing well postoperatively.  Continue current care.  Wyvonnia DuskyLAWSON, MARIE DARLENE 09/21/2014, 6:53 AM

## 2014-09-21 NOTE — Discharge Instructions (Signed)

## 2014-09-22 ENCOUNTER — Encounter (HOSPITAL_COMMUNITY): Payer: Self-pay | Admitting: Family Medicine

## 2014-10-20 ENCOUNTER — Ambulatory Visit (INDEPENDENT_AMBULATORY_CARE_PROVIDER_SITE_OTHER): Payer: Medicaid Other | Admitting: Obstetrics & Gynecology

## 2014-10-20 ENCOUNTER — Encounter: Payer: Self-pay | Admitting: Obstetrics & Gynecology

## 2014-10-20 DIAGNOSIS — K219 Gastro-esophageal reflux disease without esophagitis: Secondary | ICD-10-CM

## 2014-10-20 DIAGNOSIS — Z98891 History of uterine scar from previous surgery: Secondary | ICD-10-CM

## 2014-10-20 DIAGNOSIS — Z3041 Encounter for surveillance of contraceptive pills: Secondary | ICD-10-CM

## 2014-10-20 MED ORDER — FAMOTIDINE 20 MG PO TABS: 20.0000 mg | ORAL_TABLET | Freq: Two times a day (BID) | ORAL | Status: AC

## 2014-10-20 MED ORDER — NORGESTIMATE-ETH ESTRADIOL 0.25-35 MG-MCG PO TABS: 1.0000 | ORAL_TABLET | Freq: Every day | ORAL | Status: AC

## 2014-10-20 NOTE — Progress Notes (Signed)
    Subjective:     Renee Jenkins is a 19 y.o. [id number] female who presents for a postpartum visit. She is 4 weeks postpartum following a PLTCS for recurrent FHR decelerations and arrest of dilation. I have fully reviewed the prenatal and intrapartum course. The delivery was at 39 gestational weeks.  Postpartum course has been uncomplicated.  Baby's course has been uncomplicated. Baby is feeding by bottle. Bleeding no bleeding. Bowel function is abnormal: feels like constipation. Bladder function is normal. Patient is not sexually active. Contraception method is OCP (estrogen/progesterone). Postpartum depression screening: negative.  Reports upper abdominal pain (LUQ) after eating with burning.  Took Tums with no relief.    Also reports a few folliculitis lesions that occurred after pre-surgical shaving, wants this evaluated.   The following portions of the patient's history were reviewed and updated as appropriate: allergies, current medications, past family history, past medical history, past social history, past surgical history and problem list.  Review of Systems Pertinent items are noted in HPI.   Objective:    BP 120/59 mmHg  Pulse 57  Temp(Src) 98.4 F (36.9 C) (Oral)  Ht 5' (1.524 m)  Wt 163 lb 9.6 oz (74.208 kg)  BMI 31.95 kg/m2  Breastfeeding? No  General:  alert and no distress   Breasts:  deferred  Lungs: clear to auscultation bilaterally  Heart:  regular rate and rhythm  Abdomen: soft, non-tender; bowel sounds normal; no masses,  no organomegaly and incision C/D/I   Vulva:  positive for folliculitis lesions, no erythema, no induration, no drainage       Assessment:   Normal postpartum exam. Pap smear not done at today's visit, received Gardasil series.  Plan:   1. Contraception: OCP (estrogen/progesterone) 2.  Pepcid prescribed for GI pain. If no improvement, may need testing for H.pylori, imaging or other intervention.   3.  No intervention needed for folliculitis  at this point, conservative management recommended 4.  Follow up as needed.    Jaynie CollinsUGONNA  ANYANWU, MD, FACOG Attending Obstetrician & Gynecologist Center for [facility], [facility]

## 2014-10-20 NOTE — Progress Notes (Signed)
Pt c/o pain of have severe pain upper left quadrant

## 2014-10-23 ENCOUNTER — Emergency Department (HOSPITAL_COMMUNITY)
Admission: EM | Admit: 2014-10-23 | Discharge: 2014-10-23 | Disposition: A | Discharge status: Home / Self Care | Payer: Medicaid Other | Attending: Emergency Medicine | Admitting: Emergency Medicine

## 2014-10-23 ENCOUNTER — Encounter (HOSPITAL_COMMUNITY): Payer: Self-pay | Admitting: *Deleted

## 2014-10-23 DIAGNOSIS — Z79899 Other long term (current) drug therapy: Secondary | ICD-10-CM | POA: Diagnosis not present

## 2014-10-23 DIAGNOSIS — Z3202 Encounter for pregnancy test, result negative: Secondary | ICD-10-CM | POA: Diagnosis not present

## 2014-10-23 DIAGNOSIS — K219 Gastro-esophageal reflux disease without esophagitis: Secondary | ICD-10-CM | POA: Insufficient documentation

## 2014-10-23 DIAGNOSIS — I10 Essential (primary) hypertension: Secondary | ICD-10-CM | POA: Insufficient documentation

## 2014-10-23 DIAGNOSIS — R1013 Epigastric pain: Secondary | ICD-10-CM | POA: Diagnosis present

## 2014-10-23 LAB — CBC WITH DIFFERENTIAL/PLATELET
BASOS ABS: 0 10*3/uL (ref 0.0–0.1)
BASOS PCT: 0 % (ref 0–1)
Eosinophils Absolute: 0.1 10*3/uL (ref 0.0–0.7)
Eosinophils Relative: 2 % (ref 0–5)
HCT: 40.6 % (ref 36.0–46.0)
Hemoglobin: 13.5 g/dL (ref 12.0–15.0)
Lymphocytes Relative: 23 % (ref 12–46)
Lymphs Abs: 2.2 10*3/uL (ref 0.7–4.0)
MCH: 29.6 pg (ref 26.0–34.0)
MCHC: 33.3 g/dL (ref 30.0–36.0)
MCV: 89 fL (ref 78.0–100.0)
MONOS PCT: 5 % (ref 3–12)
Monocytes Absolute: 0.5 10*3/uL (ref 0.1–1.0)
NEUTROS PCT: 70 % (ref 43–77)
Neutro Abs: 6.5 10*3/uL (ref 1.7–7.7)
PLATELETS: 139 10*3/uL — AB (ref 150–400)
RBC: 4.56 MIL/uL (ref 3.87–5.11)
RDW: 12.4 % (ref 11.5–15.5)
WBC: 9.3 10*3/uL (ref 4.0–10.5)

## 2014-10-23 LAB — LIPASE, BLOOD: LIPASE: 23 U/L (ref 11–59)

## 2014-10-23 LAB — COMPREHENSIVE METABOLIC PANEL
ALBUMIN: 4 g/dL (ref 3.5–5.2)
ALT: 24 U/L (ref 0–35)
ANION GAP: 9 (ref 5–15)
AST: 22 U/L (ref 0–37)
Alkaline Phosphatase: 85 U/L (ref 39–117)
BUN: 13 mg/dL (ref 6–23)
CO2: 23 mmol/L (ref 19–32)
Calcium: 9.9 mg/dL (ref 8.4–10.5)
Chloride: 106 mmol/L (ref 96–112)
Creatinine, Ser: 0.78 mg/dL (ref 0.50–1.10)
GFR calc Af Amer: >90 mL/min (ref >90)
GFR calc non Af Amer: >90 mL/min (ref >90)
Glucose, Bld: 104 mg/dL — ABNORMAL HIGH (ref 70–99)
Potassium: 3.8 mmol/L (ref 3.5–5.1)
SODIUM: 138 mmol/L (ref 135–145)
TOTAL PROTEIN: 7.1 g/dL (ref 6.0–8.3)
Total Bilirubin: 0.6 mg/dL (ref 0.3–1.2)

## 2014-10-23 LAB — POC URINE PREG, ED: PREG TEST UR: NEGATIVE

## 2014-10-23 MED ORDER — OMEPRAZOLE 20 MG PO CPDR: 20.0000 mg | DELAYED_RELEASE_CAPSULE | Freq: Every day | ORAL | Status: AC

## 2014-10-23 MED ORDER — GI COCKTAIL ~~LOC~~
30.0000 mL | Freq: Once | ORAL | Status: AC
  Administered 2014-10-23: 30 mL via ORAL
  Filled 2014-10-23: qty 30

## 2014-10-23 MED ORDER — RANITIDINE HCL 150 MG/10ML PO SYRP
150.0000 mg | ORAL_SOLUTION | Freq: Once | ORAL | Status: AC
  Administered 2014-10-23: 150 mg via ORAL
  Filled 2014-10-23: qty 10

## 2014-10-23 NOTE — Discharge Instructions (Signed)
Gastroesophageal Reflux Disease, Adult  Gastroesophageal reflux disease (GERD) happens when acid from your stomach flows up into the esophagus. When acid comes in contact with the esophagus, the acid causes soreness (inflammation) in the esophagus. Over time, GERD may create small holes (ulcers) in the lining of the esophagus.  CAUSES   · Increased body weight. This puts pressure on the stomach, making acid rise from the stomach into the esophagus.  · Smoking. This increases acid production in the stomach.  · Drinking alcohol. This causes decreased pressure in the lower esophageal sphincter (valve or ring of muscle between the esophagus and stomach), allowing acid from the stomach into the esophagus.  · Late evening meals and a full stomach. This increases pressure and acid production in the stomach.  · A malformed lower esophageal sphincter.  Sometimes, no cause is found.  SYMPTOMS   · Burning pain in the lower part of the mid-chest behind the breastbone and in the mid-stomach area. This may occur twice a week or more often.  · Trouble swallowing.  · Sore throat.  · Dry cough.  · Asthma-like symptoms including chest tightness, shortness of breath, or wheezing.  DIAGNOSIS   Your caregiver may be able to diagnose GERD based on your symptoms. In some cases, X-rays and other tests may be done to check for complications or to check the condition of your stomach and esophagus.  TREATMENT   Your caregiver may recommend over-the-counter or prescription medicines to help decrease acid production. Ask your caregiver before starting or adding any new medicines.   HOME CARE INSTRUCTIONS   · Change the factors that you can control. Ask your caregiver for guidance concerning weight loss, quitting smoking, and alcohol consumption.  · Avoid foods and drinks that make your symptoms worse, such as:  ¨ Caffeine or alcoholic drinks.  ¨ Chocolate.  ¨ Peppermint or mint flavorings.  ¨ Garlic and onions.  ¨ Spicy foods.  ¨ Citrus fruits,  such as oranges, lemons, or limes.  ¨ Tomato-based foods such as sauce, chili, salsa, and pizza.  ¨ Fried and fatty foods.  · Avoid lying down for the 3 hours prior to your bedtime or prior to taking a nap.  · Eat small, frequent meals instead of large meals.  · Wear loose-fitting clothing. Do not wear anything tight around your waist that causes pressure on your stomach.  · Raise the head of your bed 6 to 8 inches with wood blocks to help you sleep. Extra pillows will not help.  · Only take over-the-counter or prescription medicines for pain, discomfort, or fever as directed by your caregiver.  · Do not take aspirin, ibuprofen, or other nonsteroidal anti-inflammatory drugs (NSAIDs).  SEEK IMMEDIATE MEDICAL CARE IF:   · You have pain in your arms, neck, jaw, teeth, or back.  · Your pain increases or changes in intensity or duration.  · You develop nausea, vomiting, or sweating (diaphoresis).  · You develop shortness of breath, or you faint.  · Your vomit is green, yellow, black, or looks like coffee grounds or blood.  · Your stool is red, bloody, or black.  These symptoms could be signs of other problems, such as heart disease, gastric bleeding, or esophageal bleeding.  MAKE SURE YOU:   · Understand these instructions.  · Will watch your condition.  · Will get help right away if you are not doing well or get worse.  Document Released: 04/05/2005 Document Revised: 09/18/2011 Document Reviewed: 01/13/2011  ExitCare® Patient   Information ©2015 [employer]. This information is not intended to replace advice given to you by your health care provider. Make sure you discuss any questions you have with your health care provider.  Food Choices for Gastroesophageal Reflux Disease  When you have gastroesophageal reflux disease (GERD), the foods you eat and your eating habits are very important. Choosing the right foods can help ease the discomfort of GERD.  WHAT GENERAL GUIDELINES DO I NEED TO FOLLOW?  · Choose fruits,  vegetables, whole grains, low-fat dairy products, and low-fat meat, fish, and poultry.  · Limit fats such as oils, salad dressings, butter, nuts, and avocado.  · Keep a food diary to identify foods that cause symptoms.  · Avoid foods that cause reflux. These may be different for different people.  · Eat frequent small meals instead of three large meals each day.  · Eat your meals slowly, in a relaxed setting.  · Limit fried foods.  · Cook foods using methods other than frying.  · Avoid drinking alcohol.  · Avoid drinking large amounts of liquids with your meals.  · Avoid bending over or lying down until 2-3 hours after eating.  WHAT FOODS ARE NOT RECOMMENDED?  The following are some foods and drinks that may worsen your symptoms:  Vegetables  Tomatoes. Tomato juice. Tomato and spaghetti sauce. Chili peppers. Onion and garlic. Horseradish.  Fruits  Oranges, grapefruit, and lemon (fruit and juice).  Meats  High-fat meats, fish, and poultry. This includes hot dogs, ribs, ham, sausage, salami, and bacon.  Dairy  Whole milk and chocolate milk. Sour cream. Cream. Butter. Ice cream. Cream cheese.   Beverages  Coffee and tea, with or without caffeine. Carbonated beverages or energy drinks.  Condiments  Hot sauce. Barbecue sauce.   Sweets/Desserts  Chocolate and cocoa. Donuts. Peppermint and spearmint.  Fats and Oils  High-fat foods, including French fries and potato chips.  Other  Vinegar. Strong spices, such as black pepper, white pepper, red pepper, cayenne, curry powder, cloves, ginger, and chili powder.  The items listed above may not be a complete list of foods and beverages to avoid. Contact your dietitian for more information.  Document Released: 06/26/2005 Document Revised: 07/01/2013 Document Reviewed: 04/30/2013  ExitCare® Patient Information ©2015 [employer]. This information is not intended to replace advice given to you by your health care provider. Make sure you discuss any questions you have with your  health care provider.

## 2014-10-23 NOTE — ED Notes (Signed)
Headache allso

## 2014-10-23 NOTE — ED Notes (Signed)
The pt has had abd pain for 2 weeks.   She had a c-cestiion 4 weeks ago.  The abd pain she has had for 2 weekls.  She is noit breast feeding.  She saw her ob dioctor on the 12th

## 2014-10-23 NOTE — ED Provider Notes (Signed)
CSN: [id number]     Arrival date & time 10/23/14  1833 History   First MD Initiated Contact with Patient 10/23/14 1849     Chief Complaint  Patient presents with  . Abdominal Pain     (Consider location/radiation/quality/duration/timing/severity/associated sxs/prior Treatment) HPI   19 year old female presenting complaining of abdominal pain. Patient reports she had a C-section 4 weeks ago. For the past 2 weeks she has had persistent upper abdominal pain which she described as stabbing sensation, worsening with eating, radiates up towards her throat. Her pain is rated as 6 out of 10. She was seen her OB doctor 3 days ago for this complaint. States that it was felt that the pain is unrelated to her C-section and patient was prescribed a PPI however she has not filled medication yet. Patient thinks that the medication may worsen the symptoms. She is currently not breast-feeding. She endorses upper back pain and also complaining of urinary urgency and abnormal urine order. Complaining of pain with bowel movement with orange stool but no blood or mucus. She endorsed nausea without vomiting. Denies having fever, chills, chest pain, shortness of breath, productive cough, vaginal discharge or rash.  Past Medical History  Diagnosis Date  . Hypertension     Chronic  . Diarrhea 2015    chronic diarrhea since pregnancy   Past Surgical History  Procedure Laterality Date  . Tonsillectomy  2006  . Cesarean section N/A 09/19/2014    Procedure: CESAREAN SECTION;  Surgeon: Levie Heritage, DO;  Location: [facility];  Service: Obstetrics;  Laterality: N/A;   Family History  Problem Relation Age of Onset  . Hypertension Mother   . Hypertension Sister   . Hypertension Maternal Grandmother    History  Substance Use Topics  . Smoking status: Never Smoker   . Smokeless tobacco: Not on file  . Alcohol Use: No   OB History    Gravida Para Term Preterm AB TAB SAB Ectopic Multiple Living   0 1     Review of Systems  All other systems reviewed and are negative.     Allergies  Review of patient's allergies indicates no known allergies.  Home Medications   Prior to Admission medications   Medication Sig Start Date End Date Taking? Authorizing Provider  famotidine (PEPCID) 20 MG tablet Take 1 tablet (20 mg total) by mouth 2 (two) times daily. 10/20/14   Tereso Newcomer, MD  norethindrone (ORTHO MICRONOR) 0.35 MG tablet Take 1 tablet (0.35 mg total) by mouth daily. 09/21/14   [place] N Rumley, DO  norgestimate-ethinyl estradiol (ORTHO-CYCLEN,SPRINTEC,PREVIFEM) 0.25-35 MG-MCG tablet Take 1 tablet by mouth daily. 10/20/14   Tereso Newcomer, MD  oxyCODONE-acetaminophen (PERCOCET/ROXICET) 5-325 MG per tablet Take 1 tablet by mouth every 4 (four) hours as needed (for pain scale less than 7). 09/21/14   Araceli Bouche, DO  Prenatal Vit-Fe Fumarate-FA (PRENATAL MULTIVITAMIN) TABS tablet Take 1 tablet by mouth daily at 12 noon.    Historical Provider, MD  senna-docusate (SENOKOT-S) 8.6-50 MG per tablet Take 2 tablets by mouth daily. 09/21/14   [place] N Rumley, DO   BP 119/54 mmHg  Pulse 83  Temp(Src) 98.6 F (37 C)  Resp 18  Ht  (1.6 m)  Wt 160 lb (72.576 kg)  BMI 28.35 kg/m2  SpO2 100% Physical Exam  Constitutional: She is oriented to person, place, and time. She appears well-developed and well-nourished. No distress.  HENT:  Head:  Atraumatic.  Eyes: Conjunctivae are normal.  Neck: Neck supple.  Cardiovascular: Normal rate and regular rhythm.   Pulmonary/Chest: Effort normal and breath sounds normal.  Abdominal: Soft. Bowel sounds are normal. She exhibits no distension. There is tenderness (epigastric tenderness without guarding or rebound tenderness).  Neurological: She is alert and oriented to person, place, and time.  Skin: No rash noted.  Psychiatric: She has a normal mood and affect.  Nursing note and vitals reviewed.   ED Course  Procedures (including critical  care time)  8:32 PM Pt with epigastric abdominal pain suggestive of GERD vs. PUD.  No black stool, no acute abdomen and labs are reassuring.  GI cocktail given.  Recommend taking H2 blocker, PPI.  Otherwise labs are reassuring.    Labs Review Labs Reviewed  CBC WITH DIFFERENTIAL/PLATELET - Abnormal; Notable for the following:    Platelets 139 (*)    All other components within normal limits  COMPREHENSIVE METABOLIC PANEL - Abnormal; Notable for the following:    Glucose, Bld 104 (*)    All other components within normal limits  LIPASE, BLOOD  URINALYSIS, ROUTINE W REFLEX MICROSCOPIC  POC URINE PREG, ED    Imaging Review No results found.   EKG Interpretation None      MDM   Final diagnoses:  Gastroesophageal reflux disease, esophagitis presence not specified    BP 113/58 mmHg  Pulse 61  Temp(Src) 98.6 F (37 C)  Resp 12  Ht 5\' 3"  (1.6 m)  Wt 160 lb (72.576 kg)  BMI 28.35 kg/m2  SpO2 100%     Fayrene HelperBowie Sevannah Madia, PA-C 10/23/14 2148  Tilden FossaElizabeth Rees, MD 10/23/14 2237

## 2014-10-23 NOTE — ED Notes (Signed)
Pt. Left with all belongings and refused wheelchair 

## 2014-10-28 ENCOUNTER — Telehealth: Payer: Self-pay

## 2014-10-28 NOTE — Telephone Encounter (Signed)
[facility] that pt called on 10/28/14 @ 1220 and stated that she gave birth on 3/12 via c-section.  She states that she is on her period now.  She is having a lot of bleeding and is changing her pad every hour.  Period started on Sunday.  Called pt and asked pt if she is continuing to have heavy bleeding.  Pt stated that her period started on Monday in which she was wearing a thin pad and the bleeding started soaking it.  Pt said she then switched to a thicker pad and is not having the same problem.  I informed pt that it sounds like her normal period.  I explained that sometimes periods usually start off light then she would have a couple days of heavy bleeding.  Pt stated "yes, that is what is going on".  Pt then asked when could she start taking her BCP because she did not start taking it as she was told.  Per Dr. Shawnie PonsPratt, pt can start today.  I informed pt that she can take it today starting with Wednesday's tablet of the first row and to make sure that she takes it at the same time everyday or she will be at risk for becoming pregnant.  Pt stated understanding.

## 2016-04-22 IMAGING — US US OB COMP +14 WK
1 series · 12 of 28 positions shown · non-contrast
Comparison: none

[Series 1: us ob comp +14 wk mfm · 71 acquisitions, 12 frames shown]
[im 3/71]
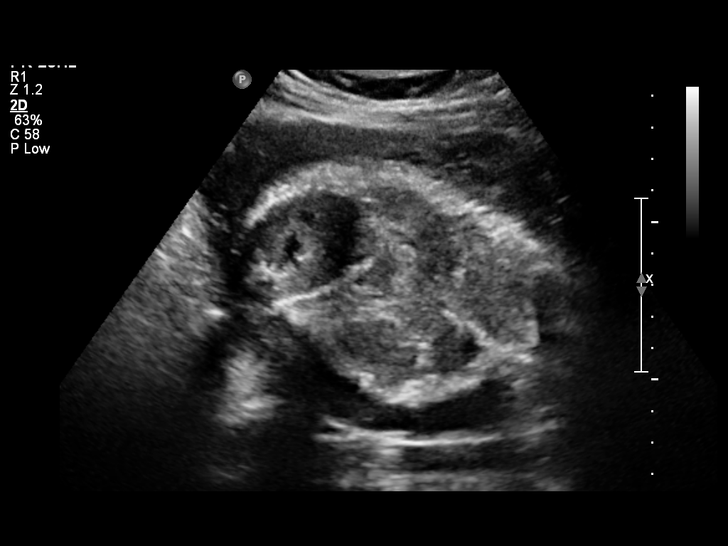
[im 8/71]
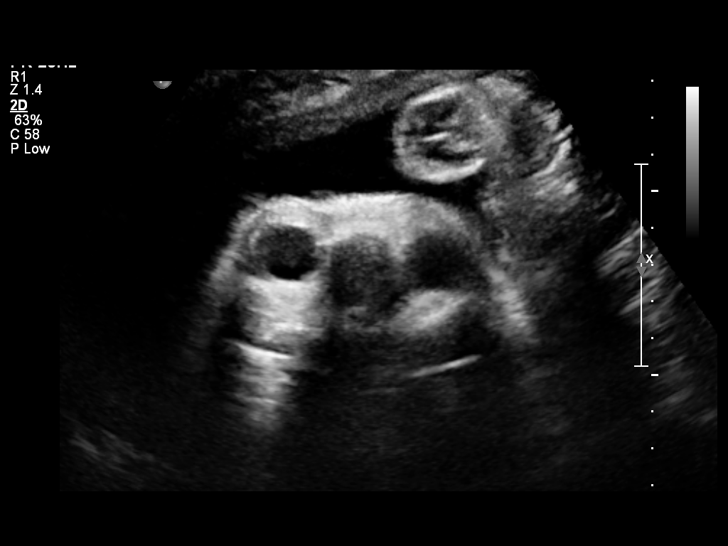
[im 13/71]
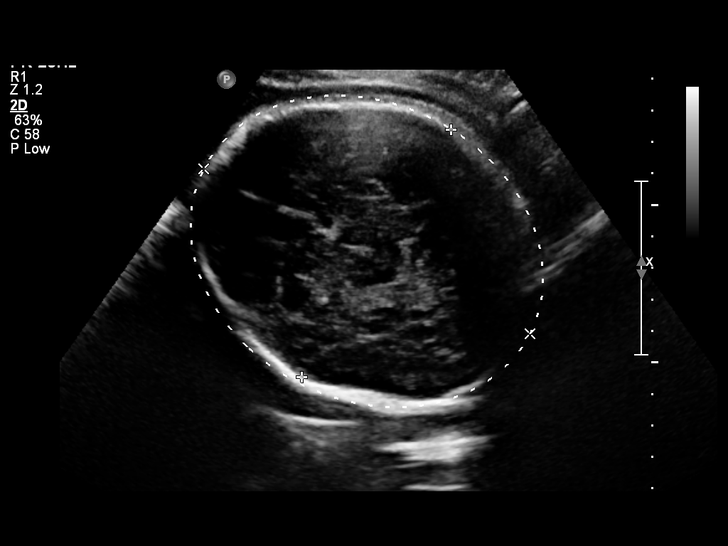
[im 21/71]
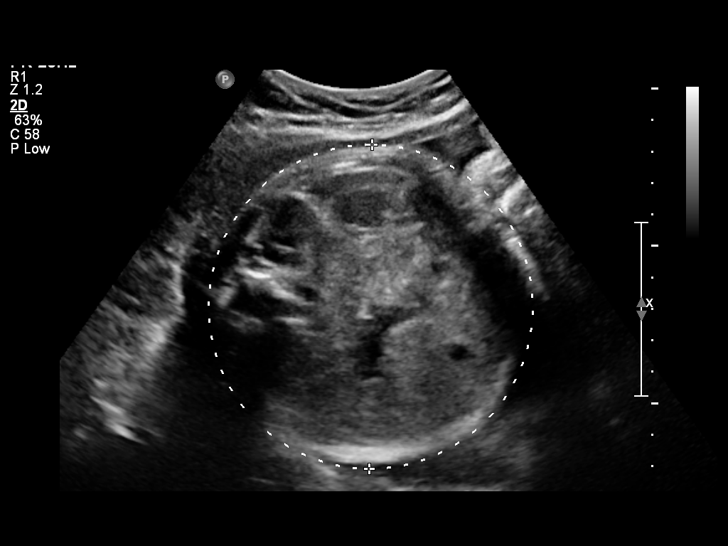
[im 26/71]
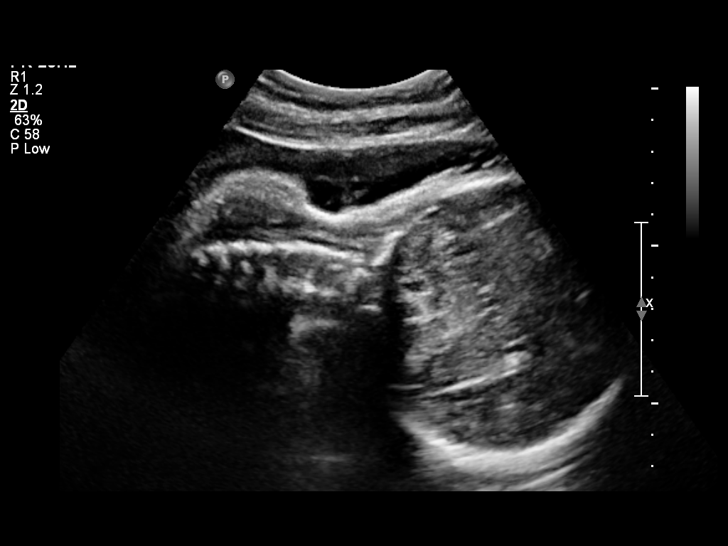
[im 32/71]
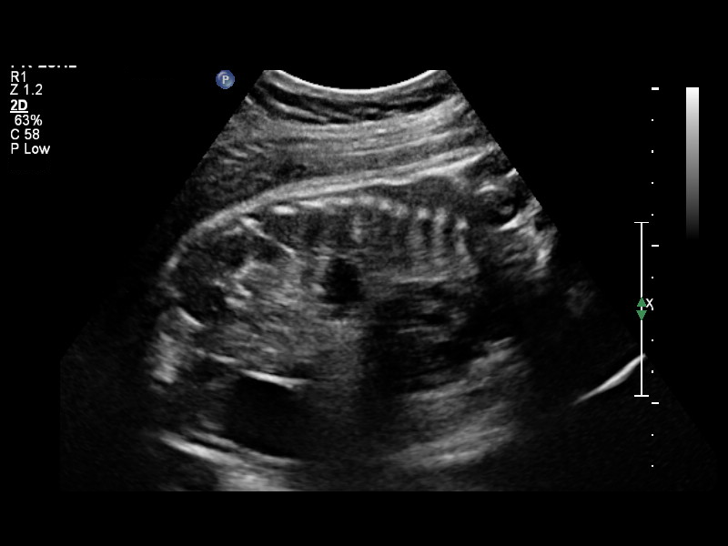
[im 39/71]
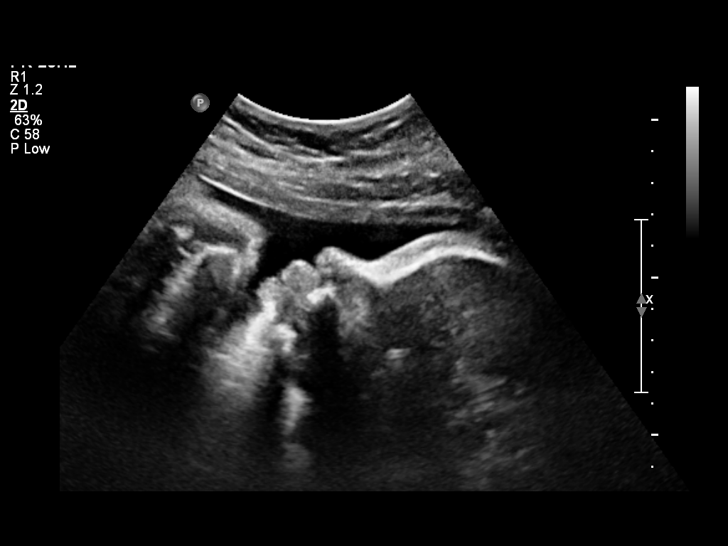
[im 45/71]
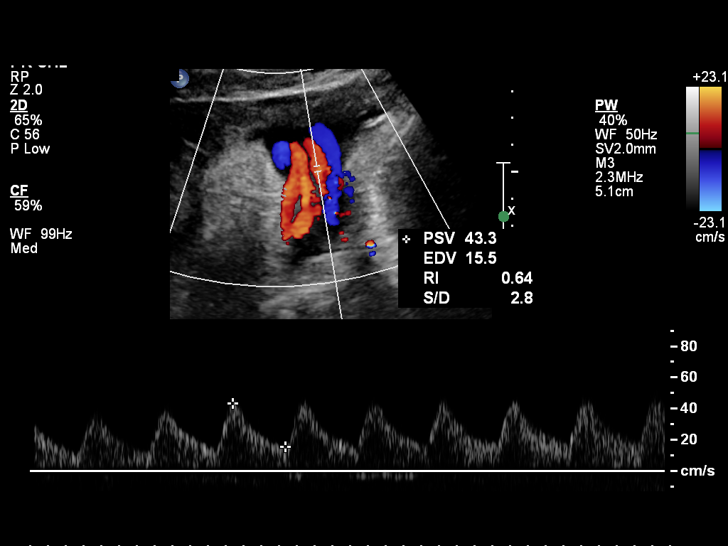
[im 50/71]
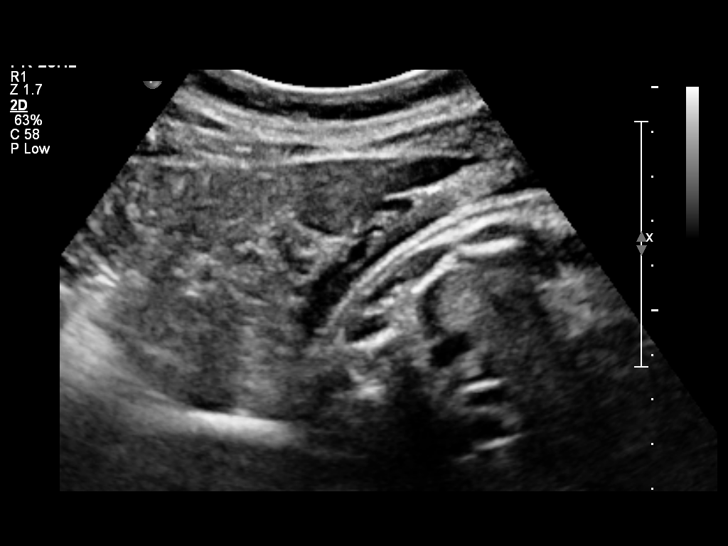
[im 58/71]
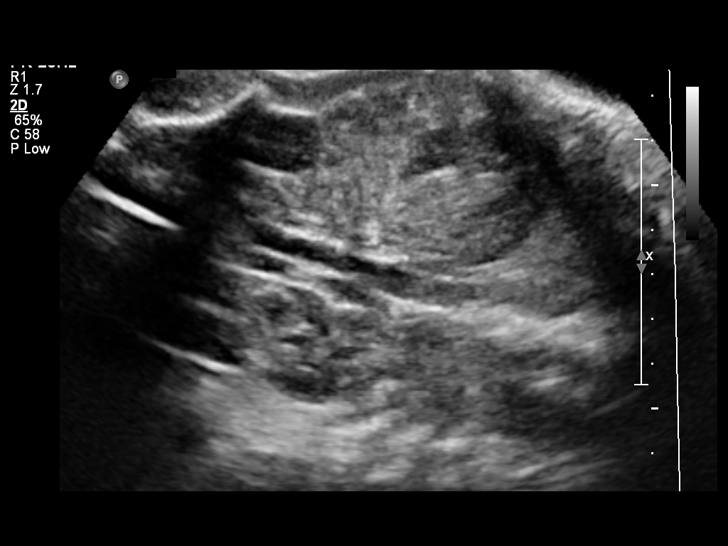
[im 63/71]
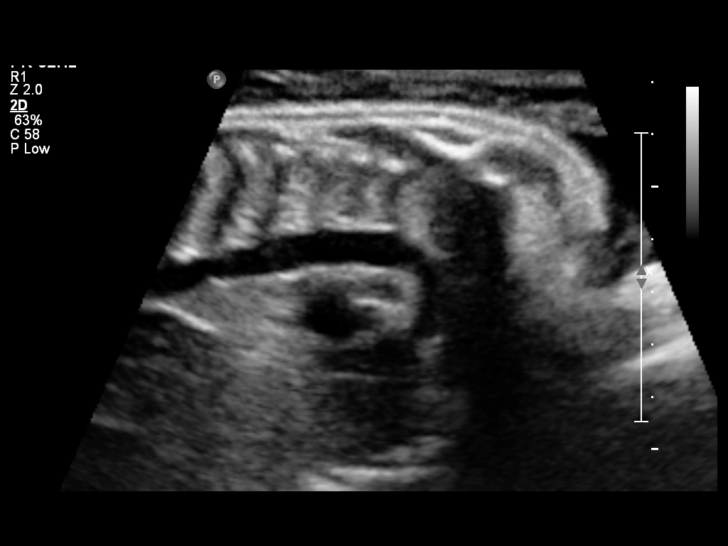
[im 68/71]
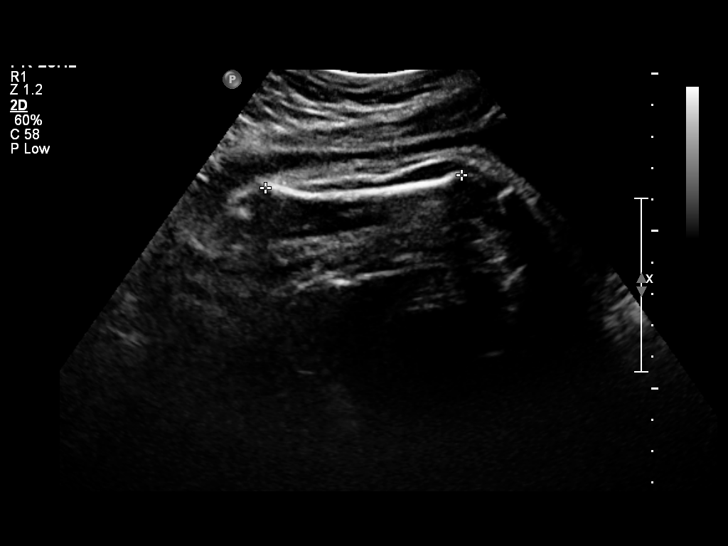

[12 of 28 positions shown; findings below may reference images not displayed]

OBSTETRICS REPORT
                      (Signed Final 09/15/2014 [DATE])

Service(s) Provided

 US OB COMP + 14 WK                                    76805.1
 US UA CORD DOPPLER                                    76820.0
Indications

 Basic anatomic survey                                 z36
 Hypertension - Chronic/Pre-existing - no meds
 38 weeks gestation of pregnancy
Fetal Evaluation

 Num Of Fetuses:    1
 Fetal Heart Rate:  135                          bpm
 Cardiac Activity:  Observed
 Presentation:      Cephalic
 Placenta:          Fundal, above cervical os
 P. Cord            Not well visualized
 Insertion:

 Amniotic Fluid
 AFI FV:      Subjectively within normal limits
 AFI Sum:     13.32   cm       52  %Tile     Larg Pckt:    6.36  cm
 RUQ:   2.25    cm   LUQ:    4.71   cm    LLQ:   6.36    cm
Biophysical Evaluation

 Amniotic F.V:   Within normal limits       F. Tone:        Observed
 F. Movement:    Observed                   Score:          [DATE]
 F. Breathing:   Observed
Biometry

 BPD:     91.7  mm     G. Age:  37w 2d                CI:        76.77   70 - 86
                                                      FL/HC:      21.9   20.6 -

 HC:     331.5  mm     G. Age:  37w 6d       18  %    HC/AC:      1.04   0.87 -

 AC:       319  mm     G. Age:  35w 6d        6  %    FL/BPD:     79.2   71 - 87
 FL:      72.6  mm     G. Age:  37w 1d       20  %    FL/AC:      22.8   20 - 24
 HUM:     61.8  mm     G. Age:  35w 5d       21  %
 CER:     43.5  mm     G. Age:  37w 4d       36  %

 Est. FW:    3492  gm      6 lb 9 oz     34  %
Gestational Age

 U/S Today:     37w 0d                                        EDD:   10/06/14
 Best:          38w 5d     Det. By:  Early Ultrasound         EDD:   09/24/14
                                     (03/10/14)
Anatomy

 Cranium:          Appears normal         Aortic Arch:      Appears normal
 Fetal Cavum:      Appears normal         Ductal Arch:      Not well visualized
 Ventricles:       Appears normal         Diaphragm:        Appears normal
 Choroid Plexus:   Appears normal         Stomach:          Appears normal, left
                                                            sided
 Cerebellum:       Appears normal         Abdomen:          Appears normal
 Posterior Fossa:  Appears normal         Abdominal Wall:   Appears nml (cord
                                                            insert, abd wall)
 Nuchal Fold:      Not applicable (>20    Cord Vessels:     Appears normal (3
                   wks GA)                                  vessel cord)
 Face:             Appears normal         Kidneys:          Appear normal
                   (orbits and profile)
 Lips:             Appears normal         Bladder:          Appears normal
 Heart:            Appears normal         Spine:            Not well visualized
                   (4CH, axis, and
                   situs)
 RVOT:             Not well visualized    Lower             Not well visualized
                                          Extremities:
 LVOT:             Not well visualized    Upper             Not well visualized
                                          Extremities:

 Other:  Technically difficult due to advanced GA and fetal position. Female
         gender.
Targeted Anatomy

 Fetal Central Nervous System
 Lat. Ventricles:
Doppler - Fetal Vessels

 Umbilical Artery
 S/D:   2.9            83  %tile
 Umbilical Artery
 Absent DFV:    No     Reverse DFV:    No

Cervix Uterus Adnexa

 Cervix:       Not visualized (advanced GA >11wks)
 Left Ovary:    Within normal limits.
 Right Ovary:   Within normal limits.
Impression

 SIUP at 38+5 weeks
 Normal detailed fetal anatomy; limited views of heart, spine
 and extremities
 Normal amniotic fluid volume
 Measurements consistent with prior US; EFW at the 34th
 %tile; AC at the 6th %tile
 UA dopplers were normal for this GA
 BPP [DATE]
Recommendations

 Induction planned for [DATE]

 questions or concerns.

## 2020-06-04 ENCOUNTER — Other Ambulatory Visit: Payer: Self-pay

## 2020-06-04 ENCOUNTER — Encounter (HOSPITAL_COMMUNITY): Payer: Self-pay

## 2020-06-04 ENCOUNTER — Ambulatory Visit (HOSPITAL_COMMUNITY)
Admission: EM | Admit: 2020-06-04 | Discharge: 2020-06-04 | Disposition: A | Discharge status: Home / Self Care | Payer: Medicaid Other | Attending: Family Medicine | Admitting: Family Medicine

## 2020-06-04 DIAGNOSIS — N3090 Cystitis, unspecified without hematuria: Secondary | ICD-10-CM | POA: Diagnosis present

## 2020-06-04 DIAGNOSIS — R102 Pelvic and perineal pain: Secondary | ICD-10-CM | POA: Diagnosis present

## 2020-06-04 LAB — POCT URINALYSIS DIPSTICK, ED / UC
Bilirubin Urine: NEGATIVE
Glucose, UA: NEGATIVE mg/dL
Ketones, ur: NEGATIVE mg/dL
Nitrite: NEGATIVE
Protein, ur: 30 mg/dL — AB
Specific Gravity, Urine: 1.025 (ref 1.005–1.030)
Urobilinogen, UA: 0.2 mg/dL (ref 0.0–1.0)
pH: 7 (ref 5.0–8.0)

## 2020-06-04 LAB — POC URINE PREG, ED: Preg Test, Ur: NEGATIVE

## 2020-06-04 MED ORDER — CEPHALEXIN 500 MG PO CAPS: 500.0000 mg | ORAL_CAPSULE | Freq: Two times a day (BID) | ORAL | 0 refills | Status: AC

## 2020-06-04 NOTE — Discharge Instructions (Signed)
Your urine test looks like a bladder infection I am going to treat you with antibiotics You will be called if any of the vaginal swab test results are positive Drink more water
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
## 2020-06-04 NOTE — ED Triage Notes (Signed)
Patient complains of vaginal and rectal bleeding that is light and bright red. Pt also complains of pressure in both areas. Pt is aox4 and ambulatory.

## 2020-06-05 LAB — URINE CULTURE

## 2020-06-07 LAB — CERVICOVAGINAL ANCILLARY ONLY
Bacterial Vaginitis (gardnerella): NEGATIVE
Chlamydia: NEGATIVE
Comment: NEGATIVE
Comment: NEGATIVE
Comment: NEGATIVE
Comment: NORMAL
Neisseria Gonorrhea: NEGATIVE
Trichomonas: NEGATIVE
